# Patient Record
Sex: Male | Born: 1960 | Race: Black or African American | Hispanic: No | Marital: Married | Smoking: Former smoker
Health system: Southern US, Community
[De-identification: ages and names within clinical notes are randomized; demographics above are authoritative.]

## PROBLEM LIST (undated history)

## (undated) DIAGNOSIS — R42 Dizziness and giddiness: Secondary | ICD-10-CM

## (undated) DIAGNOSIS — R160 Hepatomegaly, not elsewhere classified: Secondary | ICD-10-CM

## (undated) DIAGNOSIS — I10 Essential (primary) hypertension: Secondary | ICD-10-CM

## (undated) DIAGNOSIS — C189 Malignant neoplasm of colon, unspecified: Secondary | ICD-10-CM

## (undated) DIAGNOSIS — K589 Irritable bowel syndrome without diarrhea: Secondary | ICD-10-CM

## (undated) DIAGNOSIS — R7989 Other specified abnormal findings of blood chemistry: Secondary | ICD-10-CM

## (undated) DIAGNOSIS — IMO0002 Reserved for concepts with insufficient information to code with codable children: Secondary | ICD-10-CM

## (undated) DIAGNOSIS — E7800 Pure hypercholesterolemia, unspecified: Secondary | ICD-10-CM

## (undated) DIAGNOSIS — E1165 Type 2 diabetes mellitus with hyperglycemia: Secondary | ICD-10-CM

## (undated) HISTORY — DX: Essential (primary) hypertension: I10

## (undated) HISTORY — DX: Type 2 diabetes mellitus with hyperglycemia: E11.65

## (undated) HISTORY — DX: Reserved for concepts with insufficient information to code with codable children: IMO0002

## (undated) HISTORY — DX: Irritable bowel syndrome, unspecified: K58.9

## (undated) HISTORY — DX: Dizziness and giddiness: R42

## (undated) HISTORY — DX: Other specified abnormal findings of blood chemistry: R79.89

---

## 1985-09-10 HISTORY — PX: KNEE SURGERY: SHX244

## 2004-05-02 ENCOUNTER — Encounter: Admission: RE | Admit: 2004-05-02 | Discharge: 2004-05-02 | Payer: Self-pay | Admitting: Internal Medicine

## 2011-09-07 ENCOUNTER — Ambulatory Visit
Admission: RE | Admit: 2011-09-07 | Discharge: 2011-09-07 | Disposition: A | Discharge status: Home / Self Care | Payer: 59 | Source: Ambulatory Visit | Attending: Family Medicine | Admitting: Family Medicine

## 2011-09-07 ENCOUNTER — Other Ambulatory Visit: Payer: Self-pay | Admitting: Family Medicine

## 2011-09-07 DIAGNOSIS — R509 Fever, unspecified: Secondary | ICD-10-CM

## 2011-09-07 DIAGNOSIS — R059 Cough, unspecified: Secondary | ICD-10-CM

## 2011-09-07 DIAGNOSIS — R05 Cough: Secondary | ICD-10-CM

## 2011-11-16 ENCOUNTER — Other Ambulatory Visit: Payer: Self-pay | Admitting: Gastroenterology

## 2011-11-20 ENCOUNTER — Ambulatory Visit
Admission: RE | Admit: 2011-11-20 | Discharge: 2011-11-20 | Disposition: A | Discharge status: Home / Self Care | Payer: 59 | Source: Ambulatory Visit | Attending: Gastroenterology | Admitting: Gastroenterology

## 2012-05-09 ENCOUNTER — Other Ambulatory Visit: Payer: Self-pay | Admitting: Gastroenterology

## 2012-05-09 DIAGNOSIS — C187 Malignant neoplasm of sigmoid colon: Secondary | ICD-10-CM

## 2012-05-09 HISTORY — PX: COLONOSCOPY W/ BIOPSIES: SHX1374

## 2012-05-09 LAB — HM COLONOSCOPY

## 2012-05-13 ENCOUNTER — Ambulatory Visit
Admission: RE | Admit: 2012-05-13 | Discharge: 2012-05-13 | Disposition: A | Discharge status: Home / Self Care | Payer: 59 | Source: Ambulatory Visit | Attending: Gastroenterology | Admitting: Gastroenterology

## 2012-05-13 DIAGNOSIS — C187 Malignant neoplasm of sigmoid colon: Secondary | ICD-10-CM

## 2012-05-14 ENCOUNTER — Ambulatory Visit
Admission: RE | Admit: 2012-05-14 | Discharge: 2012-05-14 | Disposition: A | Discharge status: Home / Self Care | Payer: 59 | Source: Ambulatory Visit | Attending: Gastroenterology | Admitting: Gastroenterology

## 2012-05-14 MED ORDER — IOHEXOL 300 MG/ML  SOLN
100.0000 mL | Freq: Once | INTRAMUSCULAR | Status: AC | PRN
  Administered 2012-05-14: 100 mL via INTRAVENOUS

## 2012-05-19 ENCOUNTER — Encounter (INDEPENDENT_AMBULATORY_CARE_PROVIDER_SITE_OTHER): Payer: Self-pay | Admitting: Surgery

## 2012-05-19 ENCOUNTER — Ambulatory Visit (INDEPENDENT_AMBULATORY_CARE_PROVIDER_SITE_OTHER): Payer: 59 | Admitting: Surgery

## 2012-05-19 VITALS — BP 116/68 | HR 70 | Temp 97.4°F | Resp 16 | Ht 72.0 in | Wt 197.0 lb

## 2012-05-19 DIAGNOSIS — C187 Malignant neoplasm of sigmoid colon: Secondary | ICD-10-CM | POA: Insufficient documentation

## 2012-05-19 DIAGNOSIS — R160 Hepatomegaly, not elsewhere classified: Secondary | ICD-10-CM

## 2012-05-19 DIAGNOSIS — K635 Polyp of colon: Secondary | ICD-10-CM

## 2012-05-19 DIAGNOSIS — K769 Liver disease, unspecified: Secondary | ICD-10-CM

## 2012-05-19 DIAGNOSIS — D126 Benign neoplasm of colon, unspecified: Secondary | ICD-10-CM

## 2012-05-19 DIAGNOSIS — K429 Umbilical hernia without obstruction or gangrene: Secondary | ICD-10-CM

## 2012-05-19 HISTORY — DX: Hepatomegaly, not elsewhere classified: R16.0

## 2012-05-19 NOTE — Patient Instructions (Addendum)
See the Handout(s) we gave you.  Consider surgery.  Please call our office at [phone] if you wish to schedule surgery or if you have further questions / concerns.    Colon Polyps A polyp is extra tissue that grows inside your body. Colon polyps grow in the large intestine. The large intestine, also called the colon, is part of your digestive system. It is a long, hollow tube at the end of your digestive tract where your body makes and stores stool. Most polyps are not dangerous. They are benign. This means they are not cancerous. But over time, some types of polyps can turn into cancer. Polyps that are smaller than a pea are usually not harmful. But larger polyps could someday become or may already be cancerous. To be safe, doctors remove all polyps and test them.  WHO GETS POLYPS? Anyone can get polyps, but certain people are more likely than others. You may have a greater chance of getting polyps if:  You are over 50.   You have had polyps before.   Someone in your family has had polyps.   Someone in your family has had cancer of the large intestine.   Find out if someone in your family has had polyps. You may also be more likely to get polyps if you:   Eat a lot of fatty foods.   Smoke.   Drink alcohol.   Do not exercise.   Eat too much.  SYMPTOMS  Most small polyps do not cause symptoms. People often do not know they have one until their caregiver finds it during a regular checkup or while testing them for something else. Some people do have symptoms like these:  Bleeding from the anus. You might notice blood on your underwear or on toilet paper after you have had a bowel movement.   Constipation or diarrhea that lasts more than a week.   Blood in the stool. Blood can make stool look black or it can show up as red streaks in the stool.  If you have any of these symptoms, see your caregiver. HOW DOES THE DOCTOR TEST FOR POLYPS? The doctor can use four tests to check  for polyps:  Digital rectal exam. The caregiver wears gloves and checks your rectum (the last part of the large intestine) to see if it feels normal. This test would find polyps only in the rectum. Your caregiver may need to do one of the other tests listed below to find polyps higher up in the intestine.   Barium enema. The caregiver puts a liquid called barium into your rectum before taking x-rays of your large intestine. Barium makes your intestine look white in the pictures. Polyps are dark, so they are easy to see.   Sigmoidoscopy. With this test, the caregiver can see inside your large intestine. A thin flexible tube is placed into your rectum. The device is called a sigmoidoscope, which has a light and a tiny video camera in it. The caregiver uses the sigmoidoscope to look at the last third of your large intestine.   Colonoscopy. This test is like sigmoidoscopy, but the caregiver looks at all of the large intestine. It usually requires sedation. This is the most common method for finding and removing polyps.  TREATMENT   The caregiver will remove the polyp during sigmoidoscopy or colonoscopy. The polyp is then tested for cancer.   If you have had polyps, your caregiver may want you to get tested regularly in the future.  PREVENTION  There is not one sure way to prevent polyps. You might be able to lower your risk of getting them if you:  Eat more fruits and vegetables and less fatty food.   Do not smoke.   Avoid alcohol.   Exercise every day.   Lose weight if you are overweight.   Eating more calcium and folate can also lower your risk of getting polyps. Some foods that are rich in calcium are milk, cheese, and broccoli. Some foods that are rich in folate are chickpeas, kidney beans, and spinach.   Aspirin might help prevent polyps. Studies are under way.  Document Released: 05/23/2004 Document Revised: 08/16/2011 Document Reviewed: 10/29/2007 [facility] Patient Information 2012  [place], [place].
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
## 2012-05-23 ENCOUNTER — Ambulatory Visit (INDEPENDENT_AMBULATORY_CARE_PROVIDER_SITE_OTHER): Payer: Self-pay | Admitting: General Surgery

## 2012-05-27 ENCOUNTER — Encounter (HOSPITAL_COMMUNITY): Payer: Self-pay | Admitting: Pharmacy Technician

## 2012-05-30 ENCOUNTER — Encounter (HOSPITAL_COMMUNITY): Payer: Self-pay

## 2012-05-30 ENCOUNTER — Encounter (HOSPITAL_COMMUNITY)
Admission: RE | Admit: 2012-05-30 | Discharge: 2012-05-30 | Disposition: A | Discharge status: Home / Self Care | Payer: 59 | Source: Ambulatory Visit | Attending: Surgery | Admitting: Surgery

## 2012-05-30 LAB — BASIC METABOLIC PANEL
Calcium: 9.2 mg/dL (ref 8.4–10.5)
GFR calc Af Amer: >90 mL/min (ref >90)
GFR calc non Af Amer: 79 mL/min — ABNORMAL LOW (ref >90)
Glucose, Bld: 187 mg/dL — ABNORMAL HIGH (ref 70–99)
Potassium: 4.2 mEq/L (ref 3.5–5.1)
Sodium: 136 mEq/L (ref 135–145)

## 2012-05-30 LAB — SURGICAL PCR SCREEN
MRSA, PCR: NEGATIVE
Staphylococcus aureus: NEGATIVE

## 2012-05-30 LAB — CBC
Hemoglobin: 10.3 g/dL — ABNORMAL LOW (ref 13.0–17.0)
MCH: 25.7 pg — ABNORMAL LOW (ref 26.0–34.0)
MCHC: 31.5 g/dL (ref 30.0–36.0)
Platelets: 494 10*3/uL — ABNORMAL HIGH (ref 150–400)
RDW: 13.7 % (ref 11.5–15.5)

## 2012-05-30 NOTE — Patient Instructions (Signed)
20      Your procedure is scheduled on:  Thursday 06/05/2012 0730 am  Report to [facility] at 0530 AM.  Call this number if you have problems the morning of surgery: [phone]   Remember:FOLLOW  BOWEL PREP INSTRUCTIONS FROM DR.GROSS'S OFFICE AND FOLLOW CLEAR LIQUID DIET ON 06/04/2012   Do not eat food or drink liquids after midnight 06/03/2012-THEN CLEAR LIQUIDS ON 06/04/2012  Take these medicines the morning of surgery with A SIP OF WATER: PRAVASTATIN   Do not bring valuables to the hospital.  .  Leave suitcase in the car. After surgery it may be brought to your room.  For patients admitted to the hospital, checkout time is 11:00 AM the day of              Discharge.    Special Instructions: See [facility] Preparing  For Surgery Instruction Sheet.  Do not wear jewelry, lotions powders, perfumes. Women do not shave legs or underarms for 12 hours before showers. Contacts, partial plates, or dentures may not be worn into surgery.                          Patients discharged the day of surgery will not be allowed to drive home. If going home the same day of surgery, must have someone stay with you first 24 hrs.at home and arrange for someone to drive you home from the              Hospital.   Please read over the following fact sheets that you were given: MRSA              INFORMATION, BLOOD TRANSFUSION SHEET, INCENTIVE SPIROMETRY SHEET, SLEEP APNEA SHEET               Telford Nab.Amberlin Utke,RN,BSN [phone]

## 2012-06-03 ENCOUNTER — Encounter (INDEPENDENT_AMBULATORY_CARE_PROVIDER_SITE_OTHER): Payer: Self-pay

## 2012-06-04 MED ORDER — DEXTROSE 5 % IV SOLN
2.0000 g | INTRAVENOUS | Status: AC
  Administered 2012-06-05: 2 g via INTRAVENOUS
  Filled 2012-06-04: qty 2

## 2012-06-05 ENCOUNTER — Encounter (HOSPITAL_COMMUNITY): Payer: Self-pay | Admitting: *Deleted

## 2012-06-05 ENCOUNTER — Ambulatory Visit (HOSPITAL_COMMUNITY): Payer: 59 | Admitting: *Deleted

## 2012-06-05 ENCOUNTER — Encounter (HOSPITAL_COMMUNITY)
Admission: RE | Disposition: A | Discharge status: Home / Self Care | Payer: Self-pay | Source: Ambulatory Visit | Attending: Surgery

## 2012-06-05 ENCOUNTER — Inpatient Hospital Stay (HOSPITAL_COMMUNITY)
Admission: RE | Admit: 2012-06-05 | Discharge: 2012-06-08 | DRG: 334 | Disposition: A | Discharge status: Home / Self Care | Payer: 59 | Source: Ambulatory Visit | Attending: Surgery | Admitting: Surgery

## 2012-06-05 DIAGNOSIS — Z888 Allergy status to other drugs, medicaments and biological substances status: Secondary | ICD-10-CM

## 2012-06-05 DIAGNOSIS — K429 Umbilical hernia without obstruction or gangrene: Secondary | ICD-10-CM | POA: Diagnosis present

## 2012-06-05 DIAGNOSIS — C189 Malignant neoplasm of colon, unspecified: Secondary | ICD-10-CM | POA: Insufficient documentation

## 2012-06-05 DIAGNOSIS — E119 Type 2 diabetes mellitus without complications: Secondary | ICD-10-CM | POA: Diagnosis present

## 2012-06-05 DIAGNOSIS — K769 Liver disease, unspecified: Secondary | ICD-10-CM | POA: Diagnosis present

## 2012-06-05 DIAGNOSIS — R160 Hepatomegaly, not elsewhere classified: Secondary | ICD-10-CM | POA: Diagnosis present

## 2012-06-05 DIAGNOSIS — D759 Disease of blood and blood-forming organs, unspecified: Secondary | ICD-10-CM | POA: Diagnosis present

## 2012-06-05 DIAGNOSIS — R599 Enlarged lymph nodes, unspecified: Secondary | ICD-10-CM | POA: Diagnosis present

## 2012-06-05 DIAGNOSIS — K219 Gastro-esophageal reflux disease without esophagitis: Secondary | ICD-10-CM | POA: Diagnosis present

## 2012-06-05 DIAGNOSIS — C19 Malignant neoplasm of rectosigmoid junction: Principal | ICD-10-CM | POA: Diagnosis present

## 2012-06-05 DIAGNOSIS — K589 Irritable bowel syndrome without diarrhea: Secondary | ICD-10-CM | POA: Diagnosis present

## 2012-06-05 DIAGNOSIS — E7800 Pure hypercholesterolemia, unspecified: Secondary | ICD-10-CM | POA: Diagnosis present

## 2012-06-05 DIAGNOSIS — Z79899 Other long term (current) drug therapy: Secondary | ICD-10-CM

## 2012-06-05 DIAGNOSIS — C187 Malignant neoplasm of sigmoid colon: Secondary | ICD-10-CM | POA: Diagnosis present

## 2012-06-05 DIAGNOSIS — I10 Essential (primary) hypertension: Secondary | ICD-10-CM | POA: Diagnosis present

## 2012-06-05 DIAGNOSIS — D649 Anemia, unspecified: Secondary | ICD-10-CM | POA: Diagnosis present

## 2012-06-05 HISTORY — PX: LAPAROSCOPIC INCISIONAL / UMBILICAL / VENTRAL HERNIA REPAIR: SUR789

## 2012-06-05 HISTORY — DX: Hepatomegaly, not elsewhere classified: R16.0

## 2012-06-05 HISTORY — DX: Pure hypercholesterolemia, unspecified: E78.00

## 2012-06-05 HISTORY — PX: PROCTOSCOPY: SHX2266

## 2012-06-05 HISTORY — PX: UMBILICAL HERNIA REPAIR: SHX196

## 2012-06-05 LAB — CREATININE, SERUM: GFR calc Af Amer: 78 mL/min — ABNORMAL LOW (ref >90)

## 2012-06-05 LAB — GLUCOSE, CAPILLARY: Glucose-Capillary: 140 mg/dL — ABNORMAL HIGH (ref 70–99)

## 2012-06-05 LAB — CBC
HCT: 28.6 % — ABNORMAL LOW (ref 39.0–52.0)
MCV: 81.3 fL (ref 78.0–100.0)
RBC: 3.52 MIL/uL — ABNORMAL LOW (ref 4.22–5.81)
WBC: 13.1 10*3/uL — ABNORMAL HIGH (ref 4.0–10.5)

## 2012-06-05 LAB — TYPE AND SCREEN: ABO/RH(D): O POS

## 2012-06-05 SURGERY — RESECTION, RECTUM, LOW ANTERIOR, LAPAROSCOPIC
Anesthesia: General | Wound class: Contaminated

## 2012-06-05 MED ORDER — LACTATED RINGERS IV SOLN
INTRAVENOUS | Status: DC | PRN
  Administered 2012-06-05 (×3): via INTRAVENOUS

## 2012-06-05 MED ORDER — LACTATED RINGERS IV SOLN: INTRAVENOUS | Status: DC

## 2012-06-05 MED ORDER — ACETAMINOPHEN 10 MG/ML IV SOLN
INTRAVENOUS | Status: DC | PRN
  Administered 2012-06-05: 1000 mg via INTRAVENOUS

## 2012-06-05 MED ORDER — BUPIVACAINE-EPINEPHRINE 0.25% -1:200000 IJ SOLN
INTRAMUSCULAR | Status: DC | PRN
  Administered 2012-06-05: 50 mL

## 2012-06-05 MED ORDER — SODIUM CHLORIDE 0.9 % IV SOLN: 250.0000 mL | INTRAVENOUS | Status: DC | PRN

## 2012-06-05 MED ORDER — HEPARIN SODIUM (PORCINE) 5000 UNIT/ML IJ SOLN
5000.0000 [IU] | Freq: Once | INTRAMUSCULAR | Status: AC
  Administered 2012-06-05: 5000 [IU] via SUBCUTANEOUS
  Filled 2012-06-05: qty 1

## 2012-06-05 MED ORDER — SODIUM CHLORIDE 0.9 % IJ SOLN: 3.0000 mL | INTRAMUSCULAR | Status: DC | PRN

## 2012-06-05 MED ORDER — NAPROXEN 500 MG PO TABS
500.0000 mg | ORAL_TABLET | Freq: Two times a day (BID) | ORAL | Status: DC
  Administered 2012-06-05 – 2012-06-08 (×6): 500 mg via ORAL
  Filled 2012-06-05 (×8): qty 1

## 2012-06-05 MED ORDER — SODIUM CHLORIDE 0.9 % IJ SOLN
3.0000 mL | Freq: Two times a day (BID) | INTRAMUSCULAR | Status: DC
  Administered 2012-06-07: 3 mL via INTRAVENOUS

## 2012-06-05 MED ORDER — INFLUENZA VIRUS VACC SPLIT PF IM SUSP
0.5000 mL | INTRAMUSCULAR | Status: AC
  Administered 2012-06-06: 0.5 mL via INTRAMUSCULAR
  Filled 2012-06-05: qty 0.5

## 2012-06-05 MED ORDER — LACTATED RINGERS IV BOLUS (SEPSIS)
1000.0000 mL | Freq: Three times a day (TID) | INTRAVENOUS | Status: AC | PRN
  Administered 2012-06-07: 1000 mL via INTRAVENOUS

## 2012-06-05 MED ORDER — LIDOCAINE HCL (CARDIAC) 20 MG/ML IV SOLN
INTRAVENOUS | Status: DC | PRN
  Administered 2012-06-05: 75 mg via INTRAVENOUS

## 2012-06-05 MED ORDER — CISATRACURIUM BESYLATE (PF) 10 MG/5ML IV SOLN
INTRAVENOUS | Status: DC | PRN
  Administered 2012-06-05: 10 mg via INTRAVENOUS
  Administered 2012-06-05: 8 mg via INTRAVENOUS
  Administered 2012-06-05: 4 mg via INTRAVENOUS
  Administered 2012-06-05: 2 mg via INTRAVENOUS

## 2012-06-05 MED ORDER — STERILE WATER FOR IRRIGATION IR SOLN
Status: DC | PRN
  Administered 2012-06-05: 1000 mL

## 2012-06-05 MED ORDER — MIDAZOLAM HCL 5 MG/5ML IJ SOLN
INTRAMUSCULAR | Status: DC | PRN
  Administered 2012-06-05 (×2): 1 mg via INTRAVENOUS

## 2012-06-05 MED ORDER — INSULIN ASPART 100 UNIT/ML ~~LOC~~ SOLN
0.0000 [IU] | Freq: Three times a day (TID) | SUBCUTANEOUS | Status: DC
  Administered 2012-06-05 – 2012-06-08 (×4): 2 [IU] via SUBCUTANEOUS

## 2012-06-05 MED ORDER — OXYCODONE HCL 5 MG PO TABS: 5.0000 mg | ORAL_TABLET | ORAL | Status: DC | PRN

## 2012-06-05 MED ORDER — HYDROMORPHONE HCL PF 1 MG/ML IJ SOLN: INTRAMUSCULAR | Status: DC | PRN

## 2012-06-05 MED ORDER — PROMETHAZINE HCL 25 MG/ML IJ SOLN: 12.5000 mg | Freq: Four times a day (QID) | INTRAMUSCULAR | Status: DC | PRN

## 2012-06-05 MED ORDER — COLESTIPOL HCL 1 G PO TABS
1.0000 g | ORAL_TABLET | Freq: Every day | ORAL | Status: DC
  Administered 2012-06-05 – 2012-06-07 (×2): 1 g via ORAL
  Filled 2012-06-05 (×4): qty 1

## 2012-06-05 MED ORDER — METOPROLOL TARTRATE 12.5 MG HALF TABLET
12.5000 mg | ORAL_TABLET | Freq: Two times a day (BID) | ORAL | Status: DC | PRN
  Filled 2012-06-05: qty 1

## 2012-06-05 MED ORDER — METFORMIN HCL 500 MG PO TABS
500.0000 mg | ORAL_TABLET | Freq: Every day | ORAL | Status: DC
  Administered 2012-06-06 – 2012-06-08 (×3): 500 mg via ORAL
  Filled 2012-06-05 (×4): qty 1

## 2012-06-05 MED ORDER — SACCHAROMYCES BOULARDII 250 MG PO CAPS
250.0000 mg | ORAL_CAPSULE | Freq: Two times a day (BID) | ORAL | Status: DC
  Administered 2012-06-05 – 2012-06-08 (×7): 250 mg via ORAL
  Filled 2012-06-05 (×8): qty 1

## 2012-06-05 MED ORDER — INSULIN ASPART 100 UNIT/ML ~~LOC~~ SOLN: 0.0000 [IU] | Freq: Every day | SUBCUTANEOUS | Status: DC

## 2012-06-05 MED ORDER — ACETAMINOPHEN 10 MG/ML IV SOLN
INTRAVENOUS | Status: AC
  Filled 2012-06-05: qty 100

## 2012-06-05 MED ORDER — PROPOFOL 10 MG/ML IV EMUL
INTRAVENOUS | Status: DC | PRN
  Administered 2012-06-05: 200 mg via INTRAVENOUS

## 2012-06-05 MED ORDER — LORAZEPAM 2 MG/ML IJ SOLN: 0.5000 mg | Freq: Three times a day (TID) | INTRAMUSCULAR | Status: DC | PRN

## 2012-06-05 MED ORDER — LIP MEDEX EX OINT
1.0000 "application " | TOPICAL_OINTMENT | Freq: Two times a day (BID) | CUTANEOUS | Status: DC
  Administered 2012-06-05 – 2012-06-07 (×3): 1 via TOPICAL
  Filled 2012-06-05 (×2): qty 7

## 2012-06-05 MED ORDER — BUPIVACAINE 0.25 % ON-Q PUMP DUAL CATH 300 ML
300.0000 mL | INJECTION | Status: DC
  Filled 2012-06-05: qty 300

## 2012-06-05 MED ORDER — HYDROMORPHONE HCL PF 1 MG/ML IJ SOLN
0.5000 mg | INTRAMUSCULAR | Status: DC | PRN
  Administered 2012-06-05 – 2012-06-06 (×6): 1 mg via INTRAVENOUS
  Filled 2012-06-05 (×6): qty 1

## 2012-06-05 MED ORDER — ALVIMOPAN 12 MG PO CAPS
12.0000 mg | ORAL_CAPSULE | Freq: Once | ORAL | Status: AC
  Administered 2012-06-05: 12 mg via ORAL
  Filled 2012-06-05: qty 1

## 2012-06-05 MED ORDER — FENTANYL CITRATE 0.05 MG/ML IJ SOLN
INTRAMUSCULAR | Status: DC | PRN
  Administered 2012-06-05: 50 ug via INTRAVENOUS
  Administered 2012-06-05: 100 ug via INTRAVENOUS
  Administered 2012-06-05 (×6): 50 ug via INTRAVENOUS

## 2012-06-05 MED ORDER — ONDANSETRON HCL 4 MG/2ML IJ SOLN
INTRAMUSCULAR | Status: DC | PRN
  Administered 2012-06-05 (×2): 2 mg via INTRAVENOUS

## 2012-06-05 MED ORDER — DEXTROSE 5 % IV SOLN
2.0000 g | Freq: Once | INTRAVENOUS | Status: AC
  Administered 2012-06-05: 2 g via INTRAVENOUS
  Filled 2012-06-05: qty 2

## 2012-06-05 MED ORDER — HYDROMORPHONE HCL PF 1 MG/ML IJ SOLN
INTRAMUSCULAR | Status: DC | PRN
  Administered 2012-06-05 (×2): 1 mg via INTRAVENOUS

## 2012-06-05 MED ORDER — MAGIC MOUTHWASH
15.0000 mL | Freq: Four times a day (QID) | ORAL | Status: DC | PRN
  Filled 2012-06-05: qty 15

## 2012-06-05 MED ORDER — LORAZEPAM BOLUS VIA INFUSION: 0.5000 mg | Freq: Three times a day (TID) | INTRAVENOUS | Status: DC | PRN

## 2012-06-05 MED ORDER — BUPIVACAINE 0.25 % ON-Q PUMP DUAL CATH 300 ML
INJECTION | Status: DC | PRN
  Administered 2012-06-05: 300 mL

## 2012-06-05 MED ORDER — LACTATED RINGERS IV SOLN
INTRAVENOUS | Status: DC
  Administered 2012-06-05 – 2012-06-06 (×3): via INTRAVENOUS

## 2012-06-05 MED ORDER — 0.9 % SODIUM CHLORIDE (POUR BTL) OPTIME
TOPICAL | Status: DC | PRN
  Administered 2012-06-05: 1000 mL

## 2012-06-05 MED ORDER — HYDROMORPHONE HCL PF 1 MG/ML IJ SOLN
0.2500 mg | INTRAMUSCULAR | Status: DC | PRN
  Administered 2012-06-05 (×2): 0.5 mg via INTRAVENOUS

## 2012-06-05 MED ORDER — LACTATED RINGERS IR SOLN
Status: DC | PRN
  Administered 2012-06-05: 1

## 2012-06-05 MED ORDER — GLYCOPYRROLATE 0.2 MG/ML IJ SOLN
INTRAMUSCULAR | Status: DC | PRN
  Administered 2012-06-05: 0.2 mg via INTRAVENOUS

## 2012-06-05 MED ORDER — ENALAPRIL MALEATE 20 MG PO TABS
20.0000 mg | ORAL_TABLET | Freq: Every morning | ORAL | Status: DC
  Administered 2012-06-05 – 2012-06-08 (×4): 20 mg via ORAL
  Filled 2012-06-05 (×4): qty 1

## 2012-06-05 MED ORDER — HEPARIN SODIUM (PORCINE) 5000 UNIT/ML IJ SOLN
5000.0000 [IU] | Freq: Three times a day (TID) | INTRAMUSCULAR | Status: DC
  Administered 2012-06-06 – 2012-06-08 (×7): 5000 [IU] via SUBCUTANEOUS
  Filled 2012-06-05 (×10): qty 1

## 2012-06-05 MED ORDER — SODIUM CHLORIDE 0.9 % IV SOLN
INTRAVENOUS | Status: AC
  Filled 2012-06-05: qty 6

## 2012-06-05 MED ORDER — DIPHENHYDRAMINE HCL 50 MG/ML IJ SOLN
12.5000 mg | Freq: Four times a day (QID) | INTRAMUSCULAR | Status: DC | PRN
  Administered 2012-06-06: 25 mg via INTRAVENOUS
  Filled 2012-06-05: qty 1

## 2012-06-05 MED ORDER — BUPIVACAINE-EPINEPHRINE 0.25% -1:200000 IJ SOLN
INTRAMUSCULAR | Status: AC
  Filled 2012-06-05: qty 1

## 2012-06-05 MED ORDER — ACETAMINOPHEN 10 MG/ML IV SOLN
INTRAVENOUS | Status: AC
  Filled 2012-06-05: qty 1100

## 2012-06-05 MED ORDER — SODIUM CHLORIDE 0.9 % IR SOLN
Status: DC | PRN
  Administered 2012-06-05: 1000 mL

## 2012-06-05 MED ORDER — HYDROMORPHONE HCL PF 1 MG/ML IJ SOLN
INTRAMUSCULAR | Status: AC
  Filled 2012-06-05: qty 1

## 2012-06-05 MED ORDER — HYOSCYAMINE SULFATE ER 0.375 MG PO TB12
0.3750 mg | ORAL_TABLET | Freq: Two times a day (BID) | ORAL | Status: DC | PRN
  Filled 2012-06-05: qty 1

## 2012-06-05 MED ORDER — ALUM & MAG HYDROXIDE-SIMETH 200-200-20 MG/5ML PO SUSP: 30.0000 mL | Freq: Four times a day (QID) | ORAL | Status: DC | PRN

## 2012-06-05 MED ORDER — BUPIVACAINE-EPINEPHRINE PF 0.25-1:200000 % IJ SOLN
INTRAMUSCULAR | Status: AC
  Filled 2012-06-05: qty 30

## 2012-06-05 MED ORDER — NEOSTIGMINE METHYLSULFATE 1 MG/ML IJ SOLN
INTRAMUSCULAR | Status: DC | PRN
  Administered 2012-06-05: 1 mg via INTRAVENOUS

## 2012-06-05 SURGICAL SUPPLY — 86 items
APPLIER CLIP ROT 10 11.4 M/L (STAPLE)
APR CLP MED LRG 11.4X10 (STAPLE)
BLADE HEX COATED 2.75 (ELECTRODE) ×3 IMPLANT
BLADE SURG 15 STRL LF DISP TIS (BLADE) ×2 IMPLANT
BLADE SURG 15 STRL SS (BLADE) ×3
BLADE SURG ROTATE 9660 (MISCELLANEOUS) IMPLANT
CABLE HIGH FREQUENCY MONO STRZ (ELECTRODE) ×3 IMPLANT
CANISTER SUCTION 2500CC (MISCELLANEOUS) ×3 IMPLANT
CATH KIT ON Q 7.5IN SLV (PAIN MANAGEMENT) ×2 IMPLANT
CELLS DAT CNTRL 66122 CELL SVR (MISCELLANEOUS) IMPLANT
CHLORAPREP W/TINT 26ML (MISCELLANEOUS) ×3 IMPLANT
CLIP APPLIE ROT 10 11.4 M/L (STAPLE) IMPLANT
CLOTH BEACON ORANGE TIMEOUT ST (SAFETY) ×3 IMPLANT
COVER SURGICAL LIGHT HANDLE (MISCELLANEOUS) ×3 IMPLANT
DECANTER SPIKE VIAL GLASS SM (MISCELLANEOUS) ×5 IMPLANT
DISSECTOR BLUNT TIP ENDO 5MM (MISCELLANEOUS) IMPLANT
DRAPE LAPAROSCOPIC ABDOMINAL (DRAPES) ×3 IMPLANT
DRAPE WARM FLUID 44X44 (DRAPE) ×4 IMPLANT
DRSG TEGADERM 4X4.75 (GAUZE/BANDAGES/DRESSINGS) ×4 IMPLANT
ELECT CAUTERY BLADE 6.4 (BLADE) ×3 IMPLANT
ELECT REM PT RETURN 9FT ADLT (ELECTROSURGICAL) ×3
ELECTRODE REM PT RTRN 9FT ADLT (ELECTROSURGICAL) ×2 IMPLANT
ENDOLOOP SUT PDS II  0 18 (SUTURE) ×2
ENDOLOOP SUT PDS II 0 18 (SUTURE) IMPLANT
GLOVE BIO SURGEON STRL SZ 6 (GLOVE) ×4 IMPLANT
GLOVE BIOGEL PI IND STRL 6.5 (GLOVE) ×2 IMPLANT
GLOVE BIOGEL PI IND STRL 7.0 (GLOVE) ×2 IMPLANT
GLOVE BIOGEL PI INDICATOR 6.5 (GLOVE) ×1
GLOVE BIOGEL PI INDICATOR 7.0 (GLOVE) ×1
GLOVE ECLIPSE 8.0 STRL XLNG CF (GLOVE) ×3 IMPLANT
GLOVE INDICATOR 8.0 STRL GRN (GLOVE) ×3 IMPLANT
GOWN PREVENTION PLUS XXLARGE (GOWN DISPOSABLE) ×4 IMPLANT
GOWN STRL NON-REIN LRG LVL3 (GOWN DISPOSABLE) ×4 IMPLANT
GOWN STRL REIN XL XLG (GOWN DISPOSABLE) ×7 IMPLANT
KIT BASIN OR (CUSTOM PROCEDURE TRAY) ×3 IMPLANT
LEGGING LITHOTOMY PAIR STRL (DRAPES) IMPLANT
LIGASURE IMPACT 36 18CM CVD LR (INSTRUMENTS) IMPLANT
NEEDLE HYPO 22GX1.5 SAFETY (NEEDLE) ×3 IMPLANT
NS IRRIG 1000ML POUR BTL (IV SOLUTION) ×6 IMPLANT
PACK BASIC VI WITH GOWN DISP (CUSTOM PROCEDURE TRAY) ×3 IMPLANT
PENCIL BUTTON HOLSTER BLD 10FT (ELECTRODE) ×3 IMPLANT
RETRACTOR WND ALEXIS 18 MED (MISCELLANEOUS) IMPLANT
RTRCTR WOUND ALEXIS 18CM MED (MISCELLANEOUS)
SCALPEL HARMONIC ACE (MISCELLANEOUS) IMPLANT
SCISSORS LAP 5X35 DISP (ENDOMECHANICALS) ×1 IMPLANT
SEALER TISSUE G2 CVD JAW 35 (ENDOMECHANICALS) IMPLANT
SEALER TISSUE G2 CVD JAW 45CM (ENDOMECHANICALS) ×1
SET IRRIG TUBING LAPAROSCOPIC (IRRIGATION / IRRIGATOR) ×1 IMPLANT
SLEEVE ENDOPATH XCEL 5M (ENDOMECHANICALS) ×3 IMPLANT
SPONGE GAUZE 4X4 12PLY (GAUZE/BANDAGES/DRESSINGS) ×2 IMPLANT
SPONGE LAP 18X18 X RAY DECT (DISPOSABLE) ×4 IMPLANT
STAPLER CIRC ILS CVD 33MM 37CM (STAPLE) ×1 IMPLANT
STAPLER CUT CVD 40MM BLUE (STAPLE) ×1 IMPLANT
STAPLER VISISTAT 35W (STAPLE) ×2 IMPLANT
SUCTION POOLE TIP (SUCTIONS) ×3 IMPLANT
SUT ETHIBOND NAB CT1 #1 30IN (SUTURE) IMPLANT
SUT MNCRL AB 4-0 PS2 18 (SUTURE) ×3 IMPLANT
SUT NOVA NAB GS-21 0 18 T12 DT (SUTURE) IMPLANT
SUT PROLENE 0 CT 1 30 (SUTURE) IMPLANT
SUT PROLENE 0 CT 1 CR/8 (SUTURE) IMPLANT
SUT PROLENE 0 CT 2 (SUTURE) ×1 IMPLANT
SUT PROLENE 2 0 CT2 30 (SUTURE) IMPLANT
SUT PROLENE 2 0 KS (SUTURE) IMPLANT
SUT SILK 2 0 (SUTURE) ×3
SUT SILK 2 0 SH CR/8 (SUTURE) ×3 IMPLANT
SUT SILK 2-0 18XBRD TIE 12 (SUTURE) ×2 IMPLANT
SUT SILK 3 0 (SUTURE) ×3
SUT SILK 3 0 SH CR/8 (SUTURE) ×3 IMPLANT
SUT SILK 3-0 18XBRD TIE 12 (SUTURE) ×2 IMPLANT
SYR 20CC LL (SYRINGE) ×3 IMPLANT
SYR BULB IRRIGATION 50ML (SYRINGE) ×3 IMPLANT
SYS LAPSCP GELPORT 120MM (MISCELLANEOUS) ×3
SYSTEM LAPSCP GELPORT 120MM (MISCELLANEOUS) IMPLANT
TOWEL OR 17X26 10 PK STRL BLUE (TOWEL DISPOSABLE) ×4 IMPLANT
TRAY FOLEY CATH 14FRSI W/METER (CATHETERS) ×1 IMPLANT
TRAY LAP CHOLE (CUSTOM PROCEDURE TRAY) ×3 IMPLANT
TROCAR XCEL 12X100 BLDLESS (ENDOMECHANICALS) IMPLANT
TROCAR XCEL BLUNT TIP 100MML (ENDOMECHANICALS) ×2 IMPLANT
TROCAR XCEL NON-BLD 11X100MML (ENDOMECHANICALS) IMPLANT
TROCAR XCEL NON-BLD 5MMX100MML (ENDOMECHANICALS) ×3 IMPLANT
TROCAR Z-THREAD FIOS 5X100MM (TROCAR) ×2 IMPLANT
TUBING FILTER THERMOFLATOR (ELECTROSURGICAL) ×3 IMPLANT
TUNNELER SHEATH ON-Q 16GX12 DP (PAIN MANAGEMENT) ×1 IMPLANT
WATER STERILE IRR 1000ML POUR (IV SOLUTION) ×3 IMPLANT
YANKAUER SUCT BULB TIP 10FT TU (MISCELLANEOUS) ×3 IMPLANT
YANKAUER SUCT BULB TIP NO VENT (SUCTIONS) ×5 IMPLANT

## 2012-06-05 NOTE — Preoperative (Signed)
Beta Blockers   Reason not to administer Beta Blockers:Not Applicable 

## 2012-06-05 NOTE — Anesthesia Preprocedure Evaluation (Addendum)
Anesthesia Evaluation  Patient identified by MRN, date of birth, ID band Patient awake    Reviewed: Allergy & Precautions, H&P , NPO status , Patient's Chart, lab work & pertinent test results  Airway Mallampati: II TM Distance: >3 FB Neck ROM: full    Dental  (+) Caps and Dental Advisory Given,    Pulmonary neg pulmonary ROS,  breath sounds clear to auscultation  Pulmonary exam normal       Cardiovascular Exercise Tolerance: Good hypertension, Pt. on medications negative cardio ROS  Rhythm:regular Rate:Normal     Neuro/Psych negative neurological ROS  negative psych ROS   GI/Hepatic negative GI ROS, Neg liver ROS,   Endo/Other  negative endocrine ROSdiabetes, Well Controlled, Type 2, Oral Hypoglycemic Agents  Renal/GU negative Renal ROS  negative genitourinary   Musculoskeletal   Abdominal   Peds  Hematology negative hematology ROS (+) Blood dyscrasia, anemia , Hgb. 10.3   Anesthesia Other Findings   Reproductive/Obstetrics negative OB ROS                          Anesthesia Physical Anesthesia Plan  ASA: III  Anesthesia Plan: General   Post-op Pain Management:    Induction: Intravenous  Airway Management Planned: Oral ETT  Additional Equipment:   Intra-op Plan:   Post-operative Plan: Extubation in OR  Informed Consent: I have reviewed the patients History and Physical, chart, labs and discussed the procedure including the risks, benefits and alternatives for the proposed anesthesia with the patient or authorized representative who has indicated his/her understanding and acceptance.   Dental Advisory Given  Plan Discussed with: CRNA and Surgeon  Anesthesia Plan Comments:         Anesthesia Quick Evaluation

## 2012-06-05 NOTE — Anesthesia Procedure Notes (Signed)
Procedure Name: Intubation Date/Time: 06/05/2012 7:46 AM Performed by: Edison Pace Pre-anesthesia Checklist: Patient identified, Timeout performed, Emergency Drugs available, Suction available and Patient being monitored Patient Re-evaluated:Patient Re-evaluated prior to inductionOxygen Delivery Method: Circle system utilized Preoxygenation: Pre-oxygenation with 100% oxygen Intubation Type: IV induction Ventilation: Mask ventilation without difficulty Laryngoscope Size: Mac and 4 Grade View: Grade II Tube type: Oral Tube size: 7.5 mm Number of attempts: 1 Airway Equipment and Method: Stylet Placement Confirmation: ETT inserted through vocal cords under direct vision,  positive ETCO2 and breath sounds checked- equal and bilateral Secured at: 21 cm Tube secured with: Tape Dental Injury: Teeth and Oropharynx as per pre-operative assessment
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
## 2012-06-05 NOTE — Progress Notes (Signed)
Patient frequently on bedpan for passage of soft brown formed BMs. Patient relay had milk of magnesia prior surgery & has problem with constipation. Dr. Michaell Cowing paged for follow-up. Page returned without new orders. Continue to monitor stools and any c/o discomfort.

## 2012-06-05 NOTE — Transfer of Care (Signed)
Immediate Anesthesia Transfer of Care Note  Patient: William Barber  Procedure(s) Performed: Procedure(s) (LRB) with comments: HERNIA REPAIR UMBILICAL ADULT (N/A) LAPAROSCOPIC LOW ANTERIOR RESECTION (N/A) - laparoscopic assisted low anterior resection PROCTOSCOPY () - rigid proctoscopy   Patient Location: PACU  Anesthesia Type: General  Level of Consciousness: awake, oriented and patient cooperative  Airway & Oxygen Therapy: Patient Spontanous Breathing and Patient connected to face mask oxygen  Post-op Assessment: Report given to PACU RN, Post -op Vital signs reviewed and stable and Patient moving all extremities  Post vital signs: Reviewed and stable  Complications: No apparent anesthesia complications

## 2012-06-05 NOTE — Op Note (Signed)
06/05/2012  11:18 AM  PATIENT:  William Barber  51 y.o. male  Patient Care Team: Thora Lance, MD as PCP - General (Family Medicine) Petra Kuba, MD as Consulting Physician (Gastroenterology) Ardeth Sportsman, MD as Consulting Physician (General Surgery)  PRE-OPERATIVE DIAGNOSIS:  Sigmoid mass with high grade dysplasia, umbilical hernia  POST-OPERATIVE DIAGNOSIS:  Rectosigmoid mass with high grade dysplasia , umbilical hernia  PROCEDURE:  Procedure(s): HERNIA REPAIR UMBILICAL ADULT (Primary) LAPAROSCOPIC LOW ANTERIOR RESECTION PROCTOSCOPY  SURGEON:  Surgeon(s):  Ardeth Sportsman, MD  ASSISTANT:  Kandis Cocking, MD   ANESTHESIA:   local and general  EBL:  Total I/O In: 2000 [I.V.:2000] Out: 575 [Urine:450; Other:100; Blood:25]  Delay start of Pharmacological VTE agent (>24hrs) due to surgical blood loss or risk of bleeding:  no  DRAINS: none   SPECIMEN:  Source of Specimen:  Rectosigmoid colon (open end proximal) with anastomotic rings (blue stitch in proximal ring)  DISPOSITION OF SPECIMEN:  PATHOLOGY  COUNTS:  YES  PLAN OF CARE: Admit to inpatient   PATIENT DISPOSITION:  PACU - hemodynamically stable.  INDICATION: A pleasant middle-age male found to have a very large mass in his distal sigmoid colon on colonoscopy.  Biopsy shows high-grade dysplasia.  Elevated CEA.  CT scan suspicious for cancer.  I recommended segmental resection:  The anatomy & physiology of the digestive tract was discussed.  The pathophysiology was discussed.  Natural history risks without surgery was discussed.   I worked to give an overview of the disease and the frequent need to have multispecialty involvement.  I feel the risks of no intervention will lead to serious problems that outweigh the operative risks; therefore, I recommended a partial colectomy to remove the pathology.  Laparoscopic & open techniques were discussed.   Risks such as bleeding, infection, abscess, leak, reoperation,  possible ostomy, hernia, heart attack, death, and other risks were discussed.  I noted a good likelihood this will help address the problem.   Goals of post-operative recovery were discussed as well.  We will work to minimize complications.  An educational handout on the pathology was given as well.  Questions were answered.    The patient expresses understanding & wishes to proceed with surgery.  OR FINDINGS: He had a very large lesion at the rectosigmoid junction about the size of a baseball.  Strongly suspicious for cancer.  Enlarged lymph nodes going down to the superior mesenteric artery.  No obvious metastatic disease on visceral parietal peritoneum or liver.  The anastomosis rests 13 cm from the anal verge, just distal to the sacral promontory.  DESCRIPTION:   Informed consent was confirmed.  The patient underwent general anaesthesia without difficulty.  The patient was positioned appropriately.  VTE prevention in place.  The patient's abdomen was clipped, prepped, & draped in a sterile fashion.  Surgical timeout confirmed our plan.  The patient was positioned in reverse Trendelenburg.  Abdominal entry was gained using optical entry technique in the right upper abdomen.  Entry was clean.  I induced carbon dioxide insufflation.  Camera inspection revealed no injury.  Extra ports were carefully placed under direct laparoscopic visualization.  I reflected the greater omentum and the upper abdomen the small bowel in the upper abdomen. I scored the base of peritoneum of the mesentery of the left colon  from the ligament of Treitz to the peritoneal rectal reflection on the right side.  He had an obvious bulky mass at the very distal sigmoid going to  the rectosigmoid junction.  Obvious tattooing was just distal to it.   I elevated the sigmoid mesentery and got into the retro-mesenteric plane. We were able to identify the left ureter and gonadal vessels. We kept those posterior within the  retroperitoneum and elevated the left colon mesentery off that. I did free off of mesentery to the IMA pedicle but did not ligate it yet.  He had obviously enlarged lymph nodes.  They were stained purple consistent with a sentinel node effect from the tattooing done just distal to the mass. I continued distally and got into the avascular plane posterior to the mesorectum. This allowed me to help mobilize the rectum as well by freeing the mesorectum off the sacrum.I mobilized the peritoneal coverings to the paregoric reflection on both the right and left sides of the rectum.  I could see the right and left ureters and stayed away from the house.  I kept in the lateral pedicle stalks intact  I skeletonized the lymph nodes off the inferior mesenteric artery pedicle.  I went down to its takeoff from the aorta.  I isolated the inferior mesenteric vein off of the ligament of Treitz just cephalad to that as well.  He had some moderate adhesions of his proximal sigmoid colon to the ligament of Treitz but elevated dose of Fritos off to get better mobilization.  After confirming the left ureter was out of the way, I went ahead and ligated the inferior mesenteric artery pedicle just near its takeoff from the aorta.  I ligated it with a 0 PDS Endoloop.  I did ligate the inferior mesenteric vein in a similar fashion.  We ensured hemostasis. I skeletonized the mesorectum At the junction between the distal and mid rectum.  I did this using a bipolar system.. I mobilized the left colon some more to ensure good mobilization of the left colon to reach into the pelvis.  I placed a GelPort has a wound protector through a Pfannenstiel incision in the suprapubic region, taking care to avoid bladder injury. I transected at the junction between the proximal and mid rectum using a contour stapler. I was able to eviscerate the rectosigmoid and descending colon out the wound.  I chose a region at the descending/sigmoid junction that was  soft and easily reached down. I clamped the colon proximal to this area. I used a soft bowel clamp. I transected at the descending/sigmoid junction. I got healthy bleeding mucosa. I hemostatically transected the remaining specimen mesentery and sent the rectosigmoid colon specimen off to go to pathology.  We sized the colon orifice.  I chose a 33 EEA anvil stapler system. I placed the anvil to the open end of the descending colon and closed around it using a 0 Prolene pursestring.  I scrubbed down and did gentle anal dilation and advanced the EEA stapler up the rectal stump. The spike was brought out at the provimal end of the rectal stump under direct visualization.  Dr. Ezzard Standing attached the anvil of the proximal colon the spike of the stapler. Anvil was tightened down and held clamped for 60 seconds. The EEA stapler was fired and held clamped for 30 seconds. The stapler was released & removed. Noted 2o excellent anastomotic rings. Blue stitch is in the proximal ring.  He did rigid proctoscopy noted the anastomosis was at 13 cm from the anal verge consistent with the proximal rectum. I insufflated the rectum while he proximally clamped the colon. There was a negative air leak test.  There was some tension. It looked viable.  We changed gown and gloves.  We did copious irrigation with numerous liters of saline. We did a final irrigation of 900 mg clindamycin/240 mg gentamicin. We held for 5 minutes. I then did diagnostic laparoscopy deferred excellent hemostasis along the left colon dissection and retroperitoneum. The anastomosis looked healthy. It was tight at the anastomosis.  I mobilized the left colon proximally up to the splenic flexure.  With releasing some lateral bands that take the tension off of the anastomosis rested well and comfortable.   We aspirated the antibiotic irrigation. Hemostasis was good. I removed gas out of the ports. I closed the 5mm port sites using Monocryl stitch and sterile  dressing. I placed On-Q catheter and sheaths into the preperitoneal space under direct palpation. Closed the Pfannenstiel wound using a 0 Vicryl vertical peritoneal closure and a #1 PDS transverse anterior rectal fascial closure. I closed the skin with some interrupted Monocryl stitches. I placed soaked wicks in between those areas. I placed sterile dressing.  Patient is being extubated go to recovery room. I discussed postop care with the patient in detail the office in the holding area. Instructions are written. I'm about to locate family and discuss it with them as well.

## 2012-06-05 NOTE — Anesthesia Postprocedure Evaluation (Addendum)
  Anesthesia Post-op Note  Patient: William Barber  Procedure(s) Performed: Procedure(s) (LRB): HERNIA REPAIR UMBILICAL ADULT (N/A) LAPAROSCOPIC LOW ANTERIOR RESECTION (N/A) PROCTOSCOPY ()  Patient Location: PACU  Anesthesia Type: General  Level of Consciousness: awake and alert   Airway and Oxygen Therapy: Patient Spontanous Breathing  Post-op Pain: mild  Post-op Assessment: Post-op Vital signs reviewed, Patient's Cardiovascular Status Stable, Respiratory Function Stable, Patent Airway and No signs of Nausea or vomiting  Post-op Vital Signs: stable  Complications: No apparent anesthesia complications

## 2012-06-05 NOTE — Transfer of Care (Signed)
Immediate Anesthesia Transfer of Care Note  Patient: William Barber  Procedure(s) Performed: Procedure(s) (LRB) with comments: HERNIA REPAIR UMBILICAL ADULT (N/A) LAPAROSCOPIC LOW ANTERIOR RESECTION (N/A) - laparoscopic assisted low anterior resection PROCTOSCOPY () - rigid proctoscopy   Patient Location: PACU  Anesthesia Type: General  Level of Consciousness: awake, oriented, patient cooperative, lethargic and responds to stimulation  Airway & Oxygen Therapy: Patient Spontanous Breathing and Patient connected to face mask oxygen  Post-op Assessment: Report given to PACU RN, Post -op Vital signs reviewed and stable and Patient moving all extremities  Post vital signs: Reviewed and stable  Complications: No apparent anesthesia complications

## 2012-06-05 NOTE — Interval H&P Note (Signed)
History and Physical Interval Note:  06/05/2012 7:25 AM  William Barber  has presented today for surgery, with the diagnosis of sigmoid mass, umbilical hernia  The various methods of treatment have been discussed with the patient and family. After consideration of risks, benefits and other options for treatment, the patient has consented to  Procedure(s) (LRB) with comments: LAPAROSCOPIC SIGMOID COLECTOMY (N/A) - Laparoscopic Sigmoid Colectomy HERNIA REPAIR UMBILICAL ADULT (N/A) as a surgical intervention .  The patient's history has been reviewed, patient examined, no change in status, stable for surgery.  I have reviewed the patient's chart and labs.  Questions were answered to the patient's satisfaction.     Naveen Lorusso C.

## 2012-06-06 ENCOUNTER — Encounter (HOSPITAL_COMMUNITY): Payer: Self-pay | Admitting: Surgery

## 2012-06-06 DIAGNOSIS — K589 Irritable bowel syndrome without diarrhea: Secondary | ICD-10-CM | POA: Insufficient documentation

## 2012-06-06 DIAGNOSIS — I10 Essential (primary) hypertension: Secondary | ICD-10-CM | POA: Diagnosis present

## 2012-06-06 DIAGNOSIS — E119 Type 2 diabetes mellitus without complications: Secondary | ICD-10-CM | POA: Diagnosis present

## 2012-06-06 DIAGNOSIS — E7800 Pure hypercholesterolemia, unspecified: Secondary | ICD-10-CM | POA: Diagnosis present

## 2012-06-06 LAB — BASIC METABOLIC PANEL
Chloride: 99 mEq/L (ref 96–112)
Creatinine, Ser: 1.2 mg/dL (ref 0.50–1.35)
GFR calc Af Amer: 80 mL/min — ABNORMAL LOW (ref >90)
Potassium: 4 mEq/L (ref 3.5–5.1)
Sodium: 135 mEq/L (ref 135–145)

## 2012-06-06 LAB — CBC
HCT: 26.6 % — ABNORMAL LOW (ref 39.0–52.0)
Hemoglobin: 8.3 g/dL — ABNORMAL LOW (ref 13.0–17.0)
RBC: 3.26 MIL/uL — ABNORMAL LOW (ref 4.22–5.81)
RDW: 14.1 % (ref 11.5–15.5)
WBC: 10.1 10*3/uL (ref 4.0–10.5)

## 2012-06-06 LAB — HEMOGLOBIN A1C: Mean Plasma Glucose: 128 mg/dL — ABNORMAL HIGH (ref <117)

## 2012-06-06 LAB — GLUCOSE, CAPILLARY: Glucose-Capillary: 121 mg/dL — ABNORMAL HIGH (ref 70–99)

## 2012-06-06 MED ORDER — ENSURE COMPLETE PO LIQD
237.0000 mL | Freq: Two times a day (BID) | ORAL | Status: DC
  Administered 2012-06-06 – 2012-06-08 (×3): 237 mL via ORAL

## 2012-06-06 MED ORDER — MORPHINE SULFATE 4 MG/ML IJ SOLN
4.0000 mg | INTRAMUSCULAR | Status: DC | PRN
  Administered 2012-06-07: 4 mg via INTRAVENOUS
  Filled 2012-06-06: qty 1

## 2012-06-06 NOTE — Progress Notes (Signed)
INITIAL ADULT NUTRITION ASSESSMENT Date: 06/06/2012   Time: 2:54 PM Reason for Assessment: Nutrition risk   INTERVENTION: Ensure Complete BID. Encouraged high protein food/beverage choices. Will monitor.   ASSESSMENT: Male 51 y.o.  Dx: Polyp of sigmoid colon  Food/Nutrition Related Hx: Pt reports gradual weight loss of 80 pounds in the past 3 years. Pt thought this was due to eating healthier and exercising, however since pt was found to have sigmoid mass he thinks that may have been the cause of the weight loss. Pt's weight relatively stable in the past month. Pt reports eating well PTA, 2 meals/day with great appetite. Pt reports tolerating full liquid diet and is eager to have diet advanced. Pt interested in getting Ensure for additional nutrition - will order. POD#1 hernia repair and low anterior resection.    Hx:  Past Medical History  Diagnosis Date  . Diabetes mellitus   . Hypertension   . Night sweats   . Weight loss   . Colon polyp   . IBS (irritable bowel syndrome)   . Wears glasses   . Hypercholesterolemia 06/06/2012  . Liver mass on CT Sep2013 05/19/2012   Related Meds:  Scheduled Meds:   . cefoTEtan (CEFOTAN) IV  2 g Intravenous Once  . clindamycin / gentamicin INTRAPERITONEAL Lavage irrigation   Intraperitoneal To OR  . colestipol  1 g Oral QHS  . enalapril  20 mg Oral q morning - 10a  . heparin  5,000 Units Subcutaneous Q8H  . HYDROmorphone      . influenza  inactive virus vaccine  0.5 mL Intramuscular Tomorrow-1000  . insulin aspart  0-15 Units Subcutaneous TID WC  . insulin aspart  0-5 Units Subcutaneous QHS  . lip balm  1 application Topical BID  . metFORMIN  500 mg Oral Q breakfast  . naproxen  500 mg Oral BID WC  . saccharomyces boulardii  250 mg Oral BID  . sodium chloride  3 mL Intravenous Q12H   Continuous Infusions:   . bupivacaine 0.25 % ON-Q pump DUAL CATH 300 mL    . lactated ringers 75 mL/hr at 06/06/12 1436   PRN Meds:.sodium chloride, alum  & mag hydroxide-simeth, diphenhydrAMINE, HYDROmorphone (DILAUDID) injection, hyoscyamine, lactated ringers, LORazepam, magic mouthwash, metoprolol tartrate, promethazine, sodium chloride  Ht: 6' (182.9 cm)  Wt: 191 lb (86.637 kg)  Ideal Wt: 178 lb % Ideal Wt: 107  Usual Wt: 271 lb 3 years ago per pt report % Usual Wt: 70  Body mass index is 25.90 kg/(m^2).   Labs:  CMP     Component Value Date/Time   NA 135 06/06/2012 0430   K 4.0 06/06/2012 0430   CL 99 06/06/2012 0430   CO2 29 06/06/2012 0430   GLUCOSE 116* 06/06/2012 0430   BUN 9 06/06/2012 0430   CREATININE 1.20 06/06/2012 0430   CALCIUM 8.6 06/06/2012 0430   GFRNONAA 69* 06/06/2012 0430   GFRAA 80* 06/06/2012 0430   Lab Results  Component Value Date   HGBA1C 6.1* 06/06/2012   CBG (last 3)   Basename 06/06/12 1249 06/06/12 0737 06/05/12 2128  GLUCAP 97 121* 112*    Intake/Output Summary (Last 24 hours) at 06/06/12 1456 Last data filed at 06/06/12 1000  Gross per 24 hour  Intake   1360 ml  Output   3050 ml  Net  -1690 ml   Last BM - 9/26   Diet Order: Full Liquid   IVF:    bupivacaine 0.25 % ON-Q pump DUAL CATH  300 mL   lactated ringers Last Rate: 75 mL/hr at 06/06/12 1436    Estimated Nutritional Needs:   Kcal:2150-2600 Protein:90-105g Fluid:2.1-2.6L  NUTRITION DIAGNOSIS: -Increased nutrient needs (NI-5.1).  Status: Ongoing  RELATED TO: hernia repair and low anterior resection  AS EVIDENCE BY: surgical notes  MONITORING/EVALUATION(Goals): Advance diet as tolerated to regular diet.   EDUCATION NEEDS: -No education needs identified at this time   Dietitian #: [phone]  DOCUMENTATION CODES Per approved criteria  -Not Applicable    William Barber 06/06/2012, 2:54 PM

## 2012-06-06 NOTE — Progress Notes (Signed)
William Barber [id number] 1961-06-06   Subjective:  Loose BMs post-op (colon full of stool in transverse colon intraop) No major events Walking in hallways  Objective:  Vital signs:  Filed Vitals:   06/05/12 1752 06/05/12 2129 06/06/12 0224 06/06/12 0602  BP:  129/71 106/59 126/70  Pulse:  87 68 67  Temp:  99.4 F (37.4 C) 98.4 F (36.9 C) 98.3 F (36.8 C)  TempSrc:  Oral Oral Oral  Resp:  16 16 16   Height: 6' (1.829 m)     Weight: 191 lb (86.637 kg)     SpO2:  98% 97% 98%    Last BM Date: 06/05/12  Intake/Output   Yesterday:  09/26 0701 - 09/27 0700 In: 4286.3 [P.O.:240; I.V.:3996.3; IV Piggyback:50] Out: 2900 [Urine:2775; Blood:25] This shift:     Bowel function:  Flatus: y  BM: y  Physical Exam:  General: Pt awake/alert/oriented x4 in no acute distress.  Sitting up at bedside, calm/relaxed Eyes: PERRL, normal EOM.  Sclera clear.  No icterus Neuro: CN II-XII intact w/o focal sensory/motor deficits. Lymph: No head/neck/groin lymphadenopathy Psych:  No delerium/psychosis/paranoia HENT: Normocephalic, Mucus membranes moist.  No thrush Neck: Supple, No tracheal deviation Chest: No chest wall pain w good excursion CV:  Pulses intact.  Regular rhythm Abdomen: Soft.  Nondistended.  Mildly tender at incisions only.  No incarcerated hernias. Ext:  SCDs BLE.  No mjr edema.  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 male  1 Day Post-Op  Procedure(s): HERNIA REPAIR UMBILICAL ADULT LAPAROSCOPIC LOW ANTERIOR RESECTION PROCTOSCOPY  Recovering well so far  Plan:  -adv diet gradually -f/u path -glc stable - follow for DM -HTN controlled - follow -VTE prophylaxis- SCDs, etc -mobilize as tolerated to help recovery  Ardeth Sportsman,  M.D., F.A.C.S. Gastrointestinal and Minimally Invasive Surgery [facility] [address] [place], [place] [address] [phone] Main / Paging [phone] Voice Mail   06/06/2012  CARE TEAM:  PCP: Thora Lance, MD  Outpatient Care Team: Patient Care Team: Thora Lance, MD as PCP - General (Family Medicine) Petra Kuba, MD as Consulting Physician (Gastroenterology) Ardeth Sportsman, MD as Consulting Physician (General Surgery)  Inpatient Treatment Team: Treatment Team: Attending Provider: Ardeth Sportsman, MD; Technician: Lynden Ang, NT; Registered Nurse: Bethann Goo, RN   Results:   Labs: Results for orders placed during the hospital encounter of 06/05/12 (from the past 48 hour(s))  ABO/RH     Status: Normal   Collection Time   06/05/12  6:00 AM      Component Value Range Comment   ABO/RH(D) O POS     TYPE AND SCREEN     Status: Normal   Collection Time   06/05/12  6:30 AM      Component Value Range Comment   ABO/RH(D) O POS      Antibody Screen NEG      Sample Expiration 06/08/2012     GLUCOSE, CAPILLARY     Status: Abnormal   Collection Time   06/05/12  6:32 AM      Component Value Range Comment   Glucose-Capillary 130 (*) 70 - 99 mg/dL   GLUCOSE, CAPILLARY     Status: Abnormal   Collection Time  06/05/12 11:37 AM      Component Value Range Comment   Glucose-Capillary 173 (*) 70 - 99 mg/dL    Comment 1 Documented in Chart      Comment 2 Notify RN     CBC     Status: Abnormal   Collection Time   06/05/12  1:39 PM      Component Value Range Comment   WBC 13.1 (*) 4.0 - 10.5 K/uL    RBC 3.52 (*) 4.22 - 5.81 MIL/uL    Hemoglobin 9.2 (*) 13.0 - 17.0 g/dL    HCT 16.1 (*) 09.6 - 52.0 %    MCV 81.3  78.0 - 100.0 fL    MCH 26.1  26.0 - 34.0 pg    MCHC 32.2  30.0 - 36.0 g/dL    RDW 04.5  40.9 - 81.1 %    Platelets 441 (*) 150 - 400 K/uL   CREATININE, SERUM     Status: Abnormal   Collection Time   06/05/12   1:39 PM      Component Value Range Comment   Creatinine, Ser 1.23  0.50 - 1.35 mg/dL    GFR calc non Af Amer 67 (*) >90 mL/min    GFR calc Af Amer 78 (*) >90 mL/min   GLUCOSE, CAPILLARY     Status: Abnormal   Collection Time   06/05/12  4:35 PM      Component Value Range Comment   Glucose-Capillary 140 (*) 70 - 99 mg/dL   GLUCOSE, CAPILLARY     Status: Abnormal   Collection Time   06/05/12  9:28 PM      Component Value Range Comment   Glucose-Capillary 112 (*) 70 - 99 mg/dL    Comment 1 Notify RN     BASIC METABOLIC PANEL     Status: Abnormal   Collection Time   06/06/12  4:30 AM      Component Value Range Comment   Sodium 135  135 - 145 mEq/L    Potassium 4.0  3.5 - 5.1 mEq/L    Chloride 99  96 - 112 mEq/L    CO2 29  19 - 32 mEq/L    Glucose, Bld 116 (*) 70 - 99 mg/dL    BUN 9  6 - 23 mg/dL    Creatinine, Ser 9.14  0.50 - 1.35 mg/dL    Calcium 8.6  8.4 - 78.2 mg/dL    GFR calc non Af Amer 69 (*) >90 mL/min    GFR calc Af Amer 80 (*) >90 mL/min   CBC     Status: Abnormal   Collection Time   06/06/12  4:30 AM      Component Value Range Comment   WBC 10.1  4.0 - 10.5 K/uL    RBC 3.26 (*) 4.22 - 5.81 MIL/uL    Hemoglobin 8.3 (*) 13.0 - 17.0 g/dL    HCT 95.6 (*) 21.3 - 52.0 %    MCV 81.6  78.0 - 100.0 fL    MCH 25.5 (*) 26.0 - 34.0 pg    MCHC 31.2  30.0 - 36.0 g/dL    RDW 08.6  57.8 - 46.9 %    Platelets 419 (*) 150 - 400 K/uL     Imaging / Studies: No results found.  Medications / Allergies: per chart  Antibiotics: Anti-infectives     Start     Dose/Rate Route Frequency Ordered Stop   06/05/12 2000   cefoTEtan (CEFOTAN) 2 g in  dextrose 5 % 50 mL IVPB     Comments: Pharmacy may adjust dose strength for optimal dosing      2 g 100 mL/hr over 30 Minutes Intravenous  Once 06/05/12 1305 06/05/12 2117   06/05/12 0815   clindamycin (CLEOCIN) 900 mg, gentamicin (GARAMYCIN) 240 mg in sodium chloride 0.9 % 1,000 mL for intraperitoneal lavage         Intraperitoneal To  Surgery 06/05/12 0755 06/06/12 0815   06/05/12 0600   cefoTEtan (CEFOTAN) 2 g in dextrose 5 % 50 mL IVPB        2 g 100 mL/hr over 30 Minutes Intravenous On call to O.R. 06/04/12 1812 06/05/12 0730

## 2012-06-07 LAB — GLUCOSE, CAPILLARY
Glucose-Capillary: 145 mg/dL — ABNORMAL HIGH (ref 70–99)
Glucose-Capillary: 67 mg/dL — ABNORMAL LOW (ref 70–99)

## 2012-06-07 NOTE — Progress Notes (Signed)
William Barber [id number] 08-22-1961   Subjective:  Had a more solid bowel movement overnight.  C/O a small amt of upper abd incisional pain.  No nausea.  Objective:  Vital signs:  Filed Vitals:   06/06/12 1000 06/06/12 1400 06/06/12 2225 06/07/12 0557  BP: 138/82 114/71 108/65 122/78  Pulse: 80 79 73 66  Temp: 98.7 F (37.1 C) 97.8 F (36.6 C) 98.8 F (37.1 C) 98 F (36.7 C)  TempSrc: Oral Oral Oral Oral  Resp: 20 18 18 18   Height:      Weight:      SpO2: 97% 95% 96% 97%    Last BM Date: 06/05/12  Intake/Output   Yesterday:  09/27 0701 - 09/28 0700 In: 2035 [P.O.:240; I.V.:1795] Out: 3700 [Urine:3700] This shift:  Total I/O In: 240 [P.O.:240] Out: 450 [Urine:450]  Bowel function:  Flatus: y  BM: y  Physical Exam:  General: Pt awake/alert/oriented x4 in no acute distress.  Sitting up at bedside, calm/relaxed Chest: No chest wall pain w good excursion CV:  Pulses intact.  Regular rhythm Abdomen: Soft.  Nondistended.  Mildly tender at incisions only.  No incarcerated hernias. Ext:  SCDs BLE.  No mjr edema.  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 male  2 Days Post-Op  Procedure(s): HERNIA REPAIR UMBILICAL ADULT LAPAROSCOPIC LOW ANTERIOR RESECTION PROCTOSCOPY  Recovering well so far  Plan:  -adv diet to regular -f/u path -glc stable - follow for DM -HTN controlled - follow -VTE prophylaxis- SCDs, etc -mobilize as tolerated to help recovery -anticipate d/c Mon  Ardeth Sportsman, M.D., F.A.C.S. Gastrointestinal and Minimally Invasive Surgery [facility] [address] [place], [place] [address] [phone] Main / Paging [phone] Voice  Mail   06/07/2012  CARE TEAM:  PCP: Thora Lance, MD  Outpatient Care Team: Patient Care Team: Thora Lance, MD as PCP - General (Family Medicine) Petra Kuba, MD as Consulting Physician (Gastroenterology) Ardeth Sportsman, MD as Consulting Physician (General Surgery)  Inpatient Treatment Team: Treatment Team: Attending Provider: Ardeth Sportsman, MD; Registered Nurse: Bethann Goo, RN; Dietitian: Lavena Bullion, RD   Results:   Labs: Results for orders placed during the hospital encounter of 06/05/12 (from the past 48 hour(s))  GLUCOSE, CAPILLARY     Status: Abnormal   Collection Time   06/05/12 11:37 AM      Component Value Range Comment   Glucose-Capillary 173 (*) 70 - 99 mg/dL    Comment 1 Documented in Chart      Comment 2 Notify RN     CBC     Status: Abnormal   Collection Time   06/05/12  1:39 PM      Component Value Range Comment   WBC 13.1 (*) 4.0 - 10.5 K/uL    RBC 3.52 (*) 4.22 - 5.81 MIL/uL    Hemoglobin 9.2 (*) 13.0 - 17.0 g/dL    HCT 47.4 (*) 25.9 - 52.0 %    MCV 81.3  78.0 - 100.0 fL    MCH 26.1  26.0 - 34.0 pg    MCHC 32.2  30.0 - 36.0 g/dL    RDW 56.3  87.5 - 64.3 %    Platelets 441 (*)  150 - 400 K/uL   CREATININE, SERUM     Status: Abnormal   Collection Time   06/05/12  1:39 PM      Component Value Range Comment   Creatinine, Ser 1.23  0.50 - 1.35 mg/dL    GFR calc non Af Amer 67 (*) >90 mL/min    GFR calc Af Amer 78 (*) >90 mL/min   GLUCOSE, CAPILLARY     Status: Abnormal   Collection Time   06/05/12  4:35 PM      Component Value Range Comment   Glucose-Capillary 140 (*) 70 - 99 mg/dL   GLUCOSE, CAPILLARY     Status: Abnormal   Collection Time   06/05/12  9:28 PM      Component Value Range Comment   Glucose-Capillary 112 (*) 70 - 99 mg/dL    Comment 1 Notify RN     BASIC METABOLIC PANEL     Status: Abnormal   Collection Time   06/06/12  4:30 AM      Component Value Range Comment   Sodium 135  135 - 145 mEq/L    Potassium 4.0   3.5 - 5.1 mEq/L    Chloride 99  96 - 112 mEq/L    CO2 29  19 - 32 mEq/L    Glucose, Bld 116 (*) 70 - 99 mg/dL    BUN 9  6 - 23 mg/dL    Creatinine, Ser 1.02  0.50 - 1.35 mg/dL    Calcium 8.6  8.4 - 72.5 mg/dL    GFR calc non Af Amer 69 (*) >90 mL/min    GFR calc Af Amer 80 (*) >90 mL/min   CBC     Status: Abnormal   Collection Time   06/06/12  4:30 AM      Component Value Range Comment   WBC 10.1  4.0 - 10.5 K/uL    RBC 3.26 (*) 4.22 - 5.81 MIL/uL    Hemoglobin 8.3 (*) 13.0 - 17.0 g/dL    HCT 36.6 (*) 44.0 - 52.0 %    MCV 81.6  78.0 - 100.0 fL    MCH 25.5 (*) 26.0 - 34.0 pg    MCHC 31.2  30.0 - 36.0 g/dL    RDW 34.7  42.5 - 95.6 %    Platelets 419 (*) 150 - 400 K/uL   HEMOGLOBIN A1C     Status: Abnormal   Collection Time   06/06/12  4:30 AM      Component Value Range Comment   Hemoglobin A1C 6.1 (*) <5.7 %    Mean Plasma Glucose 128 (*) <117 mg/dL   GLUCOSE, CAPILLARY     Status: Abnormal   Collection Time   06/06/12  7:37 AM      Component Value Range Comment   Glucose-Capillary 121 (*) 70 - 99 mg/dL   GLUCOSE, CAPILLARY     Status: Normal   Collection Time   06/06/12 12:49 PM      Component Value Range Comment   Glucose-Capillary 97  70 - 99 mg/dL   GLUCOSE, CAPILLARY     Status: Abnormal   Collection Time   06/06/12  5:06 PM      Component Value Range Comment   Glucose-Capillary 117 (*) 70 - 99 mg/dL   GLUCOSE, CAPILLARY     Status: Abnormal   Collection Time   06/06/12 10:44 PM      Component Value Range Comment   Glucose-Capillary 107 (*) 70 - 99 mg/dL  GLUCOSE, CAPILLARY     Status: Abnormal   Collection Time   06/07/12  7:29 AM      Component Value Range Comment   Glucose-Capillary 145 (*) 70 - 99 mg/dL    Comment 1 Documented in Chart      Comment 2 Notify RN       Imaging / Studies: No results found.  Medications / Allergies: per chart  Antibiotics: Anti-infectives     Start     Dose/Rate Route Frequency Ordered Stop   06/05/12 2000   cefoTEtan  (CEFOTAN) 2 g in dextrose 5 % 50 mL IVPB     Comments: Pharmacy may adjust dose strength for optimal dosing      2 g 100 mL/hr over 30 Minutes Intravenous  Once 06/05/12 1305 06/05/12 2117   06/05/12 0815   clindamycin (CLEOCIN) 900 mg, gentamicin (GARAMYCIN) 240 mg in sodium chloride 0.9 % 1,000 mL for intraperitoneal lavage         Intraperitoneal To Surgery 06/05/12 0755 06/06/12 0815   06/05/12 0600   cefoTEtan (CEFOTAN) 2 g in dextrose 5 % 50 mL IVPB        2 g 100 mL/hr over 30 Minutes Intravenous On call to O.R. 06/04/12 1812 06/05/12 0730

## 2012-06-08 LAB — GLUCOSE, CAPILLARY
Glucose-Capillary: 168 mg/dL — ABNORMAL HIGH (ref 70–99)
Glucose-Capillary: 126 mg/dL — ABNORMAL HIGH (ref 70–99)

## 2012-06-08 MED ORDER — OXYCODONE HCL 5 MG PO TABS
10.0000 mg | ORAL_TABLET | ORAL | Status: DC | PRN
  Administered 2012-06-08: 10 mg via ORAL
  Filled 2012-06-08: qty 2

## 2012-06-08 NOTE — Discharge Summary (Signed)
Physician Discharge Summary  Patient ID: William Barber MRN: [id number] DOB/AGE: May 29, 1961 51 y.o.  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  He was transferred to the floor in stable condition.  His diet was advanced slowly.  He began to have bowel movements by POD 2.  He tolerated foley removal and urinated without difficulty.  He was ambulating without difficulty.  His pain pump was removed on day of discharge and he was started on PO narcotics.  He tolerated these well and was determined to be in stable condition for discharge home.  He will follow up with Dr Michaell Cowing in office in a couple weeks.    Treatments: IV hydration, insulin: regular and surgery: laparoscopic LAR  Discharge Exam: Blood pressure 114/72, pulse 61, temperature 97.9 F (36.6 C), temperature source Oral, resp. rate 16, height 6' (1.829 m), weight 191 lb (86.637 kg), SpO2 99.00%. General appearance: alert, cooperative and no distress Resp: clear to auscultation bilaterally Cardio: regular rate and rhythm GI: soft, appropriately tender Extremities: extremities normal, atraumatic, no cyanosis or edema Neurologic: Grossly normal Incision/Wound:clean dry and intact  Disposition: Final discharge disposition not confirmed     Medication List     As of 06/08/2012  7:57 AM    TAKE these medications         acidophilus Caps   Take by mouth daily.      colestipol 1 G tablet   Commonly known as: COLESTID   Take 1 g by mouth at bedtime.      enalapril 20 MG tablet   Commonly known as: VASOTEC   Take 20 mg by mouth every morning.      hyoscyamine 0.375 MG 12 hr tablet   Commonly known as: LEVBID   Take 0.375 mg by mouth every 12 (twelve) hours as needed. For stomach cramps      metFORMIN 500 MG tablet   Commonly known as: GLUCOPHAGE   Take 500 mg by mouth daily with breakfast.      multivitamin with minerals Tabs   Take 1 tablet by mouth daily.      naproxen sodium 220 MG tablet   Commonly known as: ANAPROX   Take 220 mg by mouth 2 (two) times daily as needed. For pain      oxyCODONE 5 MG immediate release tablet   Commonly known as: Oxy IR/ROXICODONE   Take 1-2 tablets (5-10 mg total) by mouth every 4 (four) hours as needed for pain.      pravastatin 40 MG tablet   Commonly known as: PRAVACHOL   Take 40 mg by mouth daily.           Follow-up Information    Follow up with GROSS,STEVEN C., MD. Schedule an appointment as soon as possible for a visit in 2 weeks.   Contact information:   [address] [place] [address] [phone]          Signed: Vanita Panda 06/08/2012, 7:57 AM

## 2012-06-09 ENCOUNTER — Telehealth (INDEPENDENT_AMBULATORY_CARE_PROVIDER_SITE_OTHER): Payer: Self-pay | Admitting: General Surgery

## 2012-06-09 DIAGNOSIS — C19 Malignant neoplasm of rectosigmoid junction: Secondary | ICD-10-CM

## 2012-06-09 DIAGNOSIS — C189 Malignant neoplasm of colon, unspecified: Secondary | ICD-10-CM

## 2012-06-09 NOTE — Telephone Encounter (Signed)
Message copied by Liliana Cline on Mon Jun 09, 2012  4:28 PM ------      Message from: Ardeth Sportsman      Created: Mon Jun 09, 2012  4:01 PM       1.  Please set the patient up for GI Tumor Board.            Dx: T3N0 rectosigmoid cancer            Surgeon: Michaell Cowing      Gastroenterologist: Magod            2.  Please set up consult(s):            Med Onc: Yes:  Dr Truett Perna        Rad Onc: Yes

## 2012-06-09 NOTE — Telephone Encounter (Signed)
Referral made and faxed to [phone] attn: Tiffany. Awaiting appt.

## 2012-06-10 ENCOUNTER — Telehealth: Payer: Self-pay | Admitting: Oncology

## 2012-06-10 NOTE — Telephone Encounter (Signed)
C/D 06/10/12 for appt 06/23/12

## 2012-06-10 NOTE — Telephone Encounter (Signed)
S/W pt in re NP appt 10/14 @ 1:30 w/ Dr. Truett Perna. Referring Dr. Michaell Cowing Dx-Colon NP packet mailed.

## 2012-06-10 NOTE — Progress Notes (Signed)
Quick Note:  Called results to patient's wife. T3 N0 cancer. 0/31 lymph nodes. Hopefully a good prognosis. He is rather young and it was a large bulky tumor. I recommend to sit down and discuss with medical oncology. They have appointment to see Dr. Mancel Bale in two weeks. Same day has followup with me.  So far, the patient has done remarkably well. Used pain meds only for a few days. Walking well. Regular diet. Regular bowel movements. No longer having the crampy IBS-like symptoms and back pain that he struggled with for months. His wife and he are happy how things are going so far. Encourage them to gradually be more active but not overdo it. Please call if sooner issues arise. Questions were answered. She expressed understanding and appreciation. ______

## 2012-06-19 ENCOUNTER — Encounter (INDEPENDENT_AMBULATORY_CARE_PROVIDER_SITE_OTHER): Payer: Self-pay

## 2012-06-23 ENCOUNTER — Telehealth: Payer: Self-pay | Admitting: Oncology

## 2012-06-23 ENCOUNTER — Ambulatory Visit (INDEPENDENT_AMBULATORY_CARE_PROVIDER_SITE_OTHER): Payer: 59 | Admitting: Surgery

## 2012-06-23 ENCOUNTER — Other Ambulatory Visit: Payer: Self-pay | Admitting: *Deleted

## 2012-06-23 ENCOUNTER — Encounter (INDEPENDENT_AMBULATORY_CARE_PROVIDER_SITE_OTHER): Payer: Self-pay | Admitting: Surgery

## 2012-06-23 ENCOUNTER — Ambulatory Visit (HOSPITAL_BASED_OUTPATIENT_CLINIC_OR_DEPARTMENT_OTHER): Payer: 59 | Admitting: Oncology

## 2012-06-23 ENCOUNTER — Ambulatory Visit (HOSPITAL_BASED_OUTPATIENT_CLINIC_OR_DEPARTMENT_OTHER): Payer: 59

## 2012-06-23 ENCOUNTER — Encounter: Payer: Self-pay | Admitting: Oncology

## 2012-06-23 VITALS — BP 122/84 | HR 60 | Temp 97.6°F | Resp 16 | Ht 72.0 in | Wt 187.0 lb

## 2012-06-23 VITALS — BP 152/88 | HR 74 | Temp 98.2°F | Resp 20 | Ht 72.0 in | Wt 187.5 lb

## 2012-06-23 DIAGNOSIS — D649 Anemia, unspecified: Secondary | ICD-10-CM

## 2012-06-23 DIAGNOSIS — C19 Malignant neoplasm of rectosigmoid junction: Secondary | ICD-10-CM

## 2012-06-23 DIAGNOSIS — C189 Malignant neoplasm of colon, unspecified: Secondary | ICD-10-CM

## 2012-06-23 DIAGNOSIS — E7800 Pure hypercholesterolemia, unspecified: Secondary | ICD-10-CM

## 2012-06-23 DIAGNOSIS — C187 Malignant neoplasm of sigmoid colon: Secondary | ICD-10-CM

## 2012-06-23 DIAGNOSIS — K429 Umbilical hernia without obstruction or gangrene: Secondary | ICD-10-CM

## 2012-06-23 DIAGNOSIS — R970 Elevated carcinoembryonic antigen [CEA]: Secondary | ICD-10-CM

## 2012-06-23 NOTE — Telephone Encounter (Signed)
gve the pt his oct,nov 2013 appt calendar along with the ct scan appt
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
## 2012-06-23 NOTE — Telephone Encounter (Signed)
Pt decided to wait before setting up the appt with Britta Mccreedy the dietician.
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
## 2012-06-23 NOTE — Patient Instructions (Signed)
GETTING TO GOOD BOWEL HEALTH. Irregular bowel habits such as constipation and diarrhea can lead to many problems over time.  Having one soft bowel movement a day is the most important way to prevent further problems.  The anorectal canal is designed to handle stretching and feces to safely manage our ability to get rid of solid waste (feces, poop, stool) out of our body.  BUT, hard constipated stools can act like ripping concrete bricks and diarrhea can be a burning fire to this very sensitive area of our body, causing inflamed hemorrhoids, anal fissures, increasing risk is perirectal abscesses, abdominal pain/bloating, an making irritable bowel worse.     The goal: ONE SOFT BOWEL MOVEMENT A DAY!  To have soft, regular bowel movements:    Drink at least 8 tall glasses of water a day.     Take plenty of fiber.  Fiber is the undigested part of plant food that passes into the colon, acting s "natures broom" to encourage bowel motility and movement.  Fiber can absorb and hold large amounts of water. This results in a larger, bulkier stool, which is soft and easier to pass. Work gradually over several weeks up to 6 servings a day of fiber (25g a day even more if needed) in the form of: o Vegetables -- Root (potatoes, carrots, turnips), leafy green (lettuce, salad greens, celery, spinach), or cooked high residue (cabbage, broccoli, etc) o Fruit -- Fresh (unpeeled skin & pulp), Dried (prunes, apricots, cherries, etc ),  or stewed ( applesauce)  o Whole grain breads, pasta, etc (whole wheat)  o Bran cereals    Bulking Agents -- This type of water-retaining fiber generally is easily obtained each day by one of the following:  o Psyllium bran -- The psyllium plant is remarkable because its ground seeds can retain so much water. This product is available as Metamucil, Konsyl, Effersyllium, Per Diem Fiber, or the less expensive generic preparation in drug and health food stores. Although labeled a laxative, it really  is not a laxative.  o Methylcellulose -- This is another fiber derived from wood which also retains water. It is available as Citrucel. o Polyethylene Glycol - and "artificial" fiber commonly called Miralax or Glycolax.  It is helpful for people with gassy or bloated feelings with regular fiber o Flax Seed - a less gassy fiber than psyllium   No reading or other relaxing activity while on the toilet. If bowel movements take longer than 5 minutes, you are too constipated   AVOID CONSTIPATION.  High fiber and water intake usually takes care of this.  Sometimes a laxative is needed to stimulate more frequent bowel movements, but    Laxatives are not a good long-term solution as it can wear the colon out. o Osmotics (Milk of Magnesia, Fleets phosphosoda, Magnesium citrate, MiraLax, GoLytely) are safer than  o Stimulants (Senokot, Castor Oil, Dulcolax, Ex Lax)    o Do not take laxatives for more than 7days in a row.    IF SEVERELY CONSTIPATED, try a Bowel Retraining Program: o Do not use laxatives.  o Eat a diet high in roughage, such as bran cereals and leafy vegetables.  o Drink six (6) ounces of prune or apricot juice each morning.  o Eat two (2) large servings of stewed fruit each day.  o Take one (1) heaping tablespoon of a psyllium-based bulking agent twice a day. Use sugar-free sweetener when possible to avoid excessive calories.  o Eat a normal breakfast.  o   Set aside 15 minutes after breakfast to sit on the toilet, but do not strain to have a bowel movement.  o If you do not have a bowel movement by the third day, use an enema and repeat the above steps.    Controlling diarrhea o Switch to liquids and simpler foods for a few days to avoid stressing your intestines further. o Avoid dairy products (especially milk & ice cream) for a short time.  The intestines often can lose the ability to digest lactose when stressed. o Avoid foods that cause gassiness or bloating.  Typical foods include  beans and other legumes, cabbage, broccoli, and dairy foods.  Every person has some sensitivity to other foods, so listen to our body and avoid those foods that trigger problems for you. o Adding fiber (Citrucel, Metamucil, psyllium, Miralax) gradually can help thicken stools by absorbing excess fluid and retrain the intestines to act more normally.  Slowly increase the dose over a few weeks.  Too much fiber too soon can backfire and cause cramping & bloating. o Probiotics (such as active yogurt, Align, etc) may help repopulate the intestines and colon with normal bacteria and calm down a sensitive digestive tract.  Most studies show it to be of mild help, though, and such products can be costly. o Medicines:   Bismuth subsalicylate (ex. Kayopectate, Pepto Bismol) every 30 minutes for up to 6 doses can help control diarrhea.  Avoid if pregnant.   Loperamide (Immodium) can slow down diarrhea.  Start with two tablets (4mg total) first and then try one tablet every 6 hours.  Avoid if you are having fevers or severe pain.  If you are not better or start feeling worse, stop all medicines and call your doctor for advice o Call your doctor if you are getting worse or not better.  Sometimes further testing (cultures, endoscopy, X-ray studies, bloodwork, etc) may be needed to help diagnose and treat the cause of the diarrhea.  Colorectal Cancer Colorectal cancer is an abnormal growth of tissue (tumor) in the colon or rectum that is cancerous (malignant). Unlike noncancerous (benign) tumors, malignant tumors can spread to other parts of your body. The colon is the large bowel or large intestine. The rectum is the last several inches of the colon. CAUSES  The exact cause of colon cancer is unknown.  RISK FACTORS The majority of patients do not have identifiable risk factors, but the following factors may increase your chances of getting colon cancer:  Age. Most colorectal cancers occur in people older than 50  years.  Having abnormal growths (polyps) on the inner wall of the colon or rectum.  Diabetes.  Being African American.  Family history of hereditary nonpolyposis colon cancer. This condition is caused by changes in the genes that are responsible for repairing mismatched DNA.  Family history of familial adenomatous polyposis (FAP). This is a rare, inherited condition in which hundreds of polyps form in the colon and rectum. It is caused by a change in the APC gene. Unless FAP is treated, it usually leads to colorectal cancer by age 40.  Personal history of cancer. A person who has already had colorectal cancer may develop it a second time. Also, women with a history of ovarian, uterine, or breast cancer are at a somewhat higher risk of developing colorectal cancer.  Inflammatory bowel disease, including ulcerative colitis and Crohn's disease.  Being obese or eating a diet that is high in fat (especially animal fat) and low in fiber,   fruits, and vegetables.  Smoking. A person who smokes cigarettes may be at increased risk of developing polyps and colorectal cancer.  Heavy alcohol use. SYMPTOMS Early colorectal cancer often does not cause symptoms. As the cancer grows, symptoms may include:  Diarrhea.  Constipation.  Feeling like the bowel does not empty completely after a bowel movement.  Blood in the stool.  Stools that are narrower than usual.  Abdominal discomfort, pain, bloating, fullness, or cramps.  Unexplained weight loss.  Constant tiredness.  Nausea and vomiting. DIAGNOSIS  Your caregiver will ask about your medical history. He or she may also perform a number of procedures, such as:  A physical exam.  Blood tests. This may include a routine complete blood count and iron level testing. Your caregiver may also check for carcinoembryonic antigen (CEA) and other substances in the blood. Some people who have colorectal cancer have a high CEA level. These levels may be  used to follow the activity of your colon cancer.  Chest X-rays, computed tomography (CT) scans, or magnetic resonance imaging (MRI).  Taking a tissue sample (biopsy) from the colon or rectum. The sample is examined under a microscope to look for cancer cells.  Sigmoidoscopy. With this test, your caregiver can see inside your colon. A thin flexible tube (sigmoidoscope) is placed into your rectum. This device has a light source and a tiny video camera in it. Your caregiver uses the sigmoidoscope to look at the last third of your colon.  Colonoscopy. This test is like sigmoidoscopy, but your caregiver looks at the entire colon. This test usually requires medicine that helps you relax (sedative).  Endorectal ultrasound. With this test, your caregiver can see how deep a rectal tumor has grown and whether the cancer has spread to lymph nodes or other nearby tissues. A tool (probe) is inserted into the rectum. The probe sends out sound waves to the rectum and nearby tissues, and a computer uses the echoes to create a picture. Your cancer will be staged to determine its severity and extent. Staging is a careful attempt to find out the size of the tumor, whether the cancer has spread, and if so, to what parts of the body. You may need to have more tests to determine the stage of your cancer. The test results will help determine what treatment plan is best for you. STAGES  Stage 0. The cancer is found only in the innermost lining of the colon or rectum.  Stage I. The cancer has grown into the inner wall of the colon or rectum. The cancer has not yet reached the outer wall of the colon.  Stage II. The cancer extends more deeply into or through the wall of the colon or rectum. It may have invaded nearby tissue, but cancer cells have not spread to the lymph nodes.  Stage III. The cancer has spread to nearby lymph nodes but not to other parts of the body.  Stage IV. The cancer has spread to other parts of  the body, such as the liver or lungs. Your caregiver may tell you the detailed stage of your cancer. In that case, the stage will include both a number and a letter. TREATMENT  Depending on the type and stage, colorectal cancer may be treated with surgery, radiation therapy, or chemotherapy. Some patients have a combination of these therapies.  Surgery may be done to remove the polyps from your colon. In early stages, your caregiver may be able to do this during a colonoscopy.   In later stages, surgery may be done to remove part of your colon.  Radiation therapy uses high-energy rays to kill cancer cells. This is usually recommended for patients with rectal cancer.  Chemotherapy is the use of drugs to kill cancer cells. Caregivers also give chemotherapy to help reduce pain and other problems caused by colorectal cancer. This may be done even if the cancer is not curable. HOME CARE INSTRUCTIONS   Only take over-the-counter or prescription medicines for pain, discomfort, or fever as directed by your caregiver.  Maintain a healthy diet.  Consider joining a support group. This may help you learn to cope with the stress of having colorectal cancer.  Seek advice to help you manage treatment side effects.  Keep all follow-up appointments as directed by your caregiver.  Inform your cancer specialist if you are admitted to the hospital. SEEK IMMEDIATE MEDICAL CARE IF:   Your diarrhea or constipation does not go away.  You have alternating constipation and diarrhea.  You have blood in your stools.  Your abdominal pain gets worse.  You lose weight without trying.  You notice new fatigue or weakness.  You develop a fever during chemotherapy treatment. Document Released: 08/27/2005 Document Revised: 11/19/2011 Document Reviewed: 08/14/2011 [employer] Patient Information 2013 [employer].  

## 2012-06-23 NOTE — Progress Notes (Signed)
Checked in new patient. No financial issues. °

## 2012-06-24 ENCOUNTER — Encounter: Payer: Self-pay | Admitting: Radiation Oncology

## 2012-06-24 NOTE — Progress Notes (Signed)
Met with patient and wife and explained role of GI navigator.  Resource information on colon cancer and Xeloda were given, along with cancer center support information and contact phone numbers.  Referral was made to dietician for education.  Patient was appreciative and verbalized understanding.  Will continue to follow as needed.

## 2012-06-25 ENCOUNTER — Encounter: Payer: Self-pay | Admitting: *Deleted

## 2012-06-25 ENCOUNTER — Ambulatory Visit (HOSPITAL_COMMUNITY)
Admission: RE | Admit: 2012-06-25 | Discharge: 2012-06-25 | Disposition: A | Discharge status: Home / Self Care | Payer: 59 | Source: Ambulatory Visit | Attending: Oncology | Admitting: Oncology

## 2012-06-25 ENCOUNTER — Encounter: Payer: Self-pay | Admitting: Oncology

## 2012-06-25 ENCOUNTER — Ambulatory Visit
Admission: RE | Admit: 2012-06-25 | Discharge: 2012-06-25 | Disposition: A | Discharge status: Home / Self Care | Payer: 59 | Source: Ambulatory Visit | Attending: Radiation Oncology | Admitting: Radiation Oncology

## 2012-06-25 ENCOUNTER — Encounter: Payer: Self-pay | Admitting: Radiation Oncology

## 2012-06-25 ENCOUNTER — Other Ambulatory Visit: Payer: 59

## 2012-06-25 ENCOUNTER — Other Ambulatory Visit (HOSPITAL_BASED_OUTPATIENT_CLINIC_OR_DEPARTMENT_OTHER): Payer: 59 | Admitting: Lab

## 2012-06-25 ENCOUNTER — Other Ambulatory Visit: Payer: Self-pay | Admitting: *Deleted

## 2012-06-25 VITALS — BP 131/66 | HR 74 | Temp 98.7°F | Wt 189.1 lb

## 2012-06-25 DIAGNOSIS — C189 Malignant neoplasm of colon, unspecified: Secondary | ICD-10-CM

## 2012-06-25 DIAGNOSIS — E119 Type 2 diabetes mellitus without complications: Secondary | ICD-10-CM | POA: Insufficient documentation

## 2012-06-25 DIAGNOSIS — Z87891 Personal history of nicotine dependence: Secondary | ICD-10-CM | POA: Insufficient documentation

## 2012-06-25 DIAGNOSIS — Z79899 Other long term (current) drug therapy: Secondary | ICD-10-CM | POA: Insufficient documentation

## 2012-06-25 DIAGNOSIS — E7800 Pure hypercholesterolemia, unspecified: Secondary | ICD-10-CM | POA: Insufficient documentation

## 2012-06-25 DIAGNOSIS — I10 Essential (primary) hypertension: Secondary | ICD-10-CM | POA: Insufficient documentation

## 2012-06-25 DIAGNOSIS — C187 Malignant neoplasm of sigmoid colon: Secondary | ICD-10-CM | POA: Insufficient documentation

## 2012-06-25 DIAGNOSIS — R918 Other nonspecific abnormal finding of lung field: Secondary | ICD-10-CM | POA: Insufficient documentation

## 2012-06-25 HISTORY — DX: Malignant neoplasm of colon, unspecified: C18.9

## 2012-06-25 LAB — COMPREHENSIVE METABOLIC PANEL (CC13)
ALT: 55 U/L (ref 0–55)
AST: 26 U/L (ref 5–34)
Albumin: 3.6 g/dL (ref 3.5–5.0)
CO2: 27 mEq/L (ref 22–29)
Calcium: 9.6 mg/dL (ref 8.4–10.4)
Chloride: 105 mEq/L (ref 98–107)
Creatinine: 1.1 mg/dL (ref 0.7–1.3)
Potassium: 4.2 mEq/L (ref 3.5–5.1)
Sodium: 138 mEq/L (ref 136–145)
Total Protein: 7.7 g/dL (ref 6.4–8.3)

## 2012-06-25 LAB — CBC WITH DIFFERENTIAL/PLATELET
BASO%: 1 % (ref 0.0–2.0)
HCT: 32.3 % — ABNORMAL LOW (ref 38.4–49.9)
MCHC: 32.3 g/dL (ref 32.0–36.0)
MONO#: 0.6 10*3/uL (ref 0.1–0.9)
NEUT%: 42.6 % (ref 39.0–75.0)
RDW: 15.7 % — ABNORMAL HIGH (ref 11.0–14.6)
WBC: 4.8 10*3/uL (ref 4.0–10.3)
lymph#: 1.9 10*3/uL (ref 0.9–3.3)

## 2012-06-25 LAB — CEA: CEA: 0.6 ng/mL (ref 0.0–5.0)

## 2012-06-25 MED ORDER — CAPECITABINE 500 MG PO TABS: 2000.0000 mg | ORAL_TABLET | Freq: Two times a day (BID) | ORAL | Status: DC

## 2012-06-25 MED ORDER — PROCHLORPERAZINE MALEATE 10 MG PO TABS: 10.0000 mg | ORAL_TABLET | Freq: Four times a day (QID) | ORAL | Status: DC | PRN

## 2012-06-25 NOTE — Progress Notes (Signed)
Faxed xeloda prescription to [facility] °

## 2012-06-25 NOTE — Addendum Note (Signed)
Encounter addended by: Delynn Flavin, RN on: 06/25/2012  6:09 PM<BR>     Documentation filed: Charges VN

## 2012-06-25 NOTE — Progress Notes (Signed)
[facility] visit for assessment regarding new Dx. Of colon cancer.  William Barber reports that he has been seen by Dr. Mancel Bale, Medical Oncologist.  Denies any pain today.  Weight 270 3 years ago.  Weight prior to diagnosis 215 -216.  Given go-ahead to eat his normal diet. Accompanied by his spouse, William Barber.

## 2012-06-25 NOTE — Progress Notes (Signed)
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   HISTORY OF PRESENT ILLNESS::William Barber is a 51 y.o. male who is seen for an initial consultation visit. The patient initially had some difficulties with abdominal bloating and loose stools for several years. The patient noticed some increased difficulties with his diet. This primarily was related to cheese and had the cream but he states that over the last year more foods tended to bother him. This was primarily at night. These symptoms led to further workup and he underwent a colonoscopy. A malignant appearing mass which was partially obstructing was seen in the distal sigmoid colon. A biopsy revealed an adenomatous polyp with high-grade dysplasia.  A CT scan of the abdomen and pelvis revealed a subcentimeter lesion in the hepatic dome which was too small to characterize. No clear evidence of distant metastatic disease. There was a masslike circumferential thickening of the sigmoid colon which was noted in addition to some small adjacent lymph nodes.  The patient proceeded to undergo surgical resection on 06/05/2012. 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 No angiolymphatic invasion was seen. 31 lymph nodes were removed, none demonstrating carcinoma. The resection margins were negative with the distal margin being 6 cm. The mesenteric margin was at 5 cm. This represented a T3, N0, M0 tumor.  The patient has been doing well postoperatively. He has done well in terms of pain and recovering. He also has noticed that many of his prior symptoms have resolved which is very pleased with. The patient therefore presents today for consideration  of possible radiotherapy as part of his overall treatment plan. The patient has seen Dr. Truett Perna and they have tentatively made a plan to proceed with adjuvant Xeloda.   PREVIOUS RADIATION THERAPY: No   PAST MEDICAL HISTORY:  has a past medical history of Hypertension; Night sweats; Weight loss; Colon polyp; IBS (irritable bowel syndrome); Wears glasses; Hypercholesterolemia (06/06/2012); Liver mass on CT Sep2013 (05/19/2012); Colon cancer (06/05/12); IBS (irritable bowel syndrome); and Diabetes mellitus.     PAST SURGICAL HISTORY: Past Surgical History  Procedure Date  . Knee surgery 1987    Left  . Umbilical hernia repair 06/05/2012    Procedure: HERNIA REPAIR UMBILICAL ADULT;  Surgeon: Ardeth Sportsman, MD;  Location: [facility];  Service: General;  Laterality: N/A;  . Proctoscopy 06/05/2012    Procedure: PROCTOSCOPY;  Surgeon: Ardeth Sportsman, MD;  Location: [facility];  Service: General;;  rigid proctoscopy   . Laparoscopic incisional / umbilical / ventral hernia repair 06/05/12    DR.Groat,  low anterior resection  . Colonoscopy w/ biopsies 05/09/12    mass distal sigmoid colon lesion bx'd     FAMILY HISTORY: family history includes Cancer in his paternal grandfather and sister.   SOCIAL HISTORY:  reports that he quit smoking about 27 years ago. His smoking use included Cigarettes. He has never used smokeless tobacco. He reports that he drinks alcohol. He reports that he does not use illicit drugs.   ALLERGIES: Hydromorphone hcl; Iodinated diagnostic agents; and Morphine and related   MEDICATIONS:  Current Outpatient Prescriptions  Medication Sig Dispense Refill  . colestipol (COLESTID) 1 G tablet Take 1 g by mouth at bedtime.       Marland Kitchen  enalapril (VASOTEC) 20 MG tablet Take 20 mg by mouth every morning.       . metFORMIN (GLUCOPHAGE) 500 MG tablet Take 500 mg by mouth daily with breakfast.       . Multiple Vitamin (MULTIVITAMIN WITH MINERALS) TABS Take 1 tablet by mouth daily.      .  pravastatin (PRAVACHOL) 40 MG tablet Take 40 mg by mouth daily.      . Probiotic Product (PROBIOTIC DAILY PO) Take 1 capsule by mouth daily.      Marland Kitchen oxyCODONE (OXY IR/ROXICODONE) 5 MG immediate release tablet          REVIEW OF SYSTEMS:  A 15 point review of systems is documented in the electronic medical record. This was obtained by the nursing staff. However, I reviewed this with the patient to discuss relevant findings and make appropriate changes.  Pertinent items are noted in HPI.    PHYSICAL EXAM:  weight is 189 lb 1.6 oz (85.775 kg). His temperature is 98.7 F (37.1 C). His blood pressure is 131/66 and his pulse is 74.   General: Well-developed, in no acute distress HEENT: Normocephalic, atraumatic; oral cavity clear Neck: Supple without any lymphadenopathy Cardiovascular: Regular rate and rhythm Respiratory: Clear to auscultation bilaterally GI: Soft, nontender, normal bowel sounds Extremities: No edema present Neuro: No focal deficits    LABORATORY DATA:  Lab Results  Component Value Date   WBC 4.8 06/25/2012   HGB 10.4* 06/25/2012   HCT 32.3* 06/25/2012   MCV 82.4 06/25/2012   PLT 423* 06/25/2012   Lab Results  Component Value Date   NA 138 06/25/2012   K 4.2 06/25/2012   CL 105 06/25/2012   CO2 27 06/25/2012   Lab Results  Component Value Date   ALT 55 06/25/2012   AST 26 06/25/2012   ALKPHOS 68 06/25/2012   BILITOT 0.60 06/25/2012      RADIOGRAPHY: No results found.     IMPRESSION: 51 year old male status post resection of a T3, N0, M0 cancer of the distal sigmoid colon. The patient's case was discussed at multidisciplinary GI conference this morning. I been a chance to review personally his films and pathology report in detail and we discussed his case including the intraoperative findings. This represents a distal sigmoid colon which extended to the peritoneal reflection. In reviewing his case there is not significant concern with regards to local  control. I do not believe for his case adjuvant radiotherapy would provide significant benefit and I discussed this with him. At conference, the consensus was that proceeding with adjuvant chemotherapy would be appropriate and this has been set up already. The patient and his wife had several questions which were answered. They are agreeable to the above plan.   PLAN: The patient will followup when necessary.       ________________________________   Radene Gunning, MD, PhD

## 2012-06-26 ENCOUNTER — Telehealth: Payer: Self-pay | Admitting: *Deleted

## 2012-06-26 DIAGNOSIS — D649 Anemia, unspecified: Secondary | ICD-10-CM

## 2012-06-26 MED ORDER — FERROUS SULFATE 325 (65 FE) MG PO TBEC: 325.0000 mg | DELAYED_RELEASE_TABLET | Freq: Two times a day (BID) | ORAL | Status: DC

## 2012-06-26 NOTE — Telephone Encounter (Signed)
Notified of normal CEA results. MD suggests trying ferrous sulfate 325 mg bid. He will try. Suggested he take stool softener and/or MiraLax as needed.

## 2012-06-26 NOTE — Telephone Encounter (Signed)
Message copied by Wandalee Ferdinand on Thu Jun 26, 2012  5:19 PM ------      Message from: Thornton Papas B      Created: Thu Jun 26, 2012  7:19 AM       Please call patient.cea is now normal, start ferrous sulfate 325mg  twice daily

## 2012-06-27 NOTE — Addendum Note (Signed)
Encounter addended by: Alba Kriesel Mintz Ferdinand Revoir, RN on: 06/27/2012 12:31 PM<BR>     Documentation filed: Charges VN

## 2012-06-27 NOTE — Telephone Encounter (Signed)
RECEIVED A FAX FROM [facility] CONCERNING A CONFIRMATION OF PRESCRIPTION SHIPMENT FOR XELODA ON 06/26/12. 

## 2012-07-17 ENCOUNTER — Ambulatory Visit (HOSPITAL_BASED_OUTPATIENT_CLINIC_OR_DEPARTMENT_OTHER): Payer: 59 | Admitting: Oncology

## 2012-07-17 ENCOUNTER — Ambulatory Visit (HOSPITAL_BASED_OUTPATIENT_CLINIC_OR_DEPARTMENT_OTHER): Payer: 59 | Admitting: Lab

## 2012-07-17 ENCOUNTER — Telehealth: Payer: Self-pay | Admitting: Oncology

## 2012-07-17 VITALS — BP 134/75 | HR 73 | Temp 98.0°F | Resp 20 | Ht 72.0 in | Wt 200.8 lb

## 2012-07-17 DIAGNOSIS — K7689 Other specified diseases of liver: Secondary | ICD-10-CM

## 2012-07-17 DIAGNOSIS — C19 Malignant neoplasm of rectosigmoid junction: Secondary | ICD-10-CM

## 2012-07-17 DIAGNOSIS — C187 Malignant neoplasm of sigmoid colon: Secondary | ICD-10-CM

## 2012-07-17 DIAGNOSIS — J984 Other disorders of lung: Secondary | ICD-10-CM

## 2012-07-17 DIAGNOSIS — D649 Anemia, unspecified: Secondary | ICD-10-CM

## 2012-07-17 LAB — CBC WITH DIFFERENTIAL/PLATELET
BASO%: 0.9 % (ref 0.0–2.0)
EOS%: 3 % (ref 0.0–7.0)
MCH: 28.8 pg (ref 27.2–33.4)
MCHC: 33.6 g/dL (ref 32.0–36.0)
MONO#: 0.5 10*3/uL (ref 0.1–0.9)
RBC: 3.52 10*6/uL — ABNORMAL LOW (ref 4.20–5.82)
WBC: 4.9 10*3/uL (ref 4.0–10.3)
lymph#: 2.1 10*3/uL (ref 0.9–3.3)

## 2012-07-17 NOTE — Progress Notes (Signed)
   [facility]    OFFICE PROGRESS NOTE   INTERVAL HISTORY:   He returns as scheduled. He began a first cycle of Xeloda on 06/30/2012. No nausea, diarrhea, or hand/foot pain. Mild soreness in the mouth without ulcers. He is eating a normal diet.   A staging chest CT on 06/25/2012 revealed a 3.1 mm subpleural nodule in the left lower lobe and a faint groundglass nodular in the right upper lobe. No adenopathy. No effusion. Stable too small to characterize liver lesions.  Objective:  Vital signs in last 24 hours:  Blood pressure 134/75, pulse 73, temperature 98 F (36.7 C), temperature source Oral, resp. rate 20, height 6' (1.829 m), weight 200 lb 12.8 oz (91.082 kg).    HEENT: No thrush or ulcers Resp: Lungs clear bilaterally Cardio: Regular rate and rhythm GI: Nontender, no hepatomegaly Vascular: No leg edema Skin: Dryness of the skin at the hands. No erythema or skin breakdown.    Lab Results:  Lab Results  Component Value Date   WBC 4.9 07/17/2012   HGB 10.1* 07/17/2012   HCT 30.2* 07/17/2012   MCV 85.7 07/17/2012   PLT 360 07/17/2012   ANC 2.1    Medications: I have reviewed the patient's current medications.  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 He began adjuvant Xeloda on 06/30/2012. 2. Elevated preoperative CEA , normal on 06/25/2012. 3. Subcentimeter indeterminate hepatic dome lesion on a CT 05/14/2012  4. Diabetes  5. Anemia-likely iron deficiency , stable he has not started iron replacement 6. Hypercholesterolemia  7. Hypertension  8. Tiny nodular lung lesions noted on the staging CT 06/23/2012-likely benign, we will consider obtaining a restaging CT in 6-9 months.  Disposition:  He tolerated the first cycle of Xeloda without significant acute toxicity. He will begin cycle 2 on 07/21/2012. Mr. Tutterow will  return for an office visit on 08/06/2012.   Thornton Papas, MD  07/17/2012  11:43 AM

## 2012-07-17 NOTE — Progress Notes (Signed)
Home glucose monitoring runs 118-140. Reports mild tenderness in mouth and fingertips. Some darkening of moles.

## 2012-07-17 NOTE — Telephone Encounter (Signed)
appts made and printed for pt aom °

## 2012-07-18 ENCOUNTER — Encounter: Payer: Self-pay | Admitting: *Deleted

## 2012-07-18 NOTE — Progress Notes (Signed)
RECEIVED A FAX FROM [facility] CONCERNING A CONFIRMATION OF PRESCRIPTION SHIPMENT FOR XELODA ON 07/17/12. 

## 2012-07-21 ENCOUNTER — Encounter (INDEPENDENT_AMBULATORY_CARE_PROVIDER_SITE_OTHER): Payer: 59 | Admitting: Surgery

## 2012-08-04 ENCOUNTER — Telehealth: Payer: Self-pay | Admitting: *Deleted

## 2012-08-04 NOTE — Telephone Encounter (Signed)
Patient agreed to move 11/27 appt. To 11/26 at 0945 at request of MD/

## 2012-08-05 ENCOUNTER — Ambulatory Visit (HOSPITAL_BASED_OUTPATIENT_CLINIC_OR_DEPARTMENT_OTHER): Payer: 59 | Admitting: Oncology

## 2012-08-05 ENCOUNTER — Telehealth: Payer: Self-pay | Admitting: Oncology

## 2012-08-05 ENCOUNTER — Other Ambulatory Visit (HOSPITAL_BASED_OUTPATIENT_CLINIC_OR_DEPARTMENT_OTHER): Payer: 59 | Admitting: Lab

## 2012-08-05 VITALS — BP 119/76 | HR 66 | Temp 99.0°F | Resp 20 | Ht 72.0 in | Wt 207.3 lb

## 2012-08-05 DIAGNOSIS — C187 Malignant neoplasm of sigmoid colon: Secondary | ICD-10-CM

## 2012-08-05 DIAGNOSIS — C19 Malignant neoplasm of rectosigmoid junction: Secondary | ICD-10-CM

## 2012-08-05 DIAGNOSIS — K7689 Other specified diseases of liver: Secondary | ICD-10-CM

## 2012-08-05 DIAGNOSIS — D649 Anemia, unspecified: Secondary | ICD-10-CM

## 2012-08-05 DIAGNOSIS — L270 Generalized skin eruption due to drugs and medicaments taken internally: Secondary | ICD-10-CM

## 2012-08-05 LAB — CBC WITH DIFFERENTIAL/PLATELET
BASO%: 0.6 % (ref 0.0–2.0)
Basophils Absolute: 0 10*3/uL (ref 0.0–0.1)
EOS%: 2.8 % (ref 0.0–7.0)
HCT: 32.2 % — ABNORMAL LOW (ref 38.4–49.9)
HGB: 10.6 g/dL — ABNORMAL LOW (ref 13.0–17.1)
MCH: 29.4 pg (ref 27.2–33.4)
MCHC: 33 g/dL (ref 32.0–36.0)
MONO#: 0.3 10*3/uL (ref 0.1–0.9)
NEUT%: 55.5 % (ref 39.0–75.0)
RDW: 16.7 % — ABNORMAL HIGH (ref 11.0–14.6)
WBC: 4.2 10*3/uL (ref 4.0–10.3)
lymph#: 1.4 10*3/uL (ref 0.9–3.3)

## 2012-08-05 LAB — COMPREHENSIVE METABOLIC PANEL (CC13)
ALT: 20 U/L (ref 0–55)
AST: 20 U/L (ref 5–34)
Alkaline Phosphatase: 72 U/L (ref 40–150)
Sodium: 135 mEq/L — ABNORMAL LOW (ref 136–145)
Total Bilirubin: 1.12 mg/dL (ref 0.20–1.20)
Total Protein: 7.6 g/dL (ref 6.4–8.3)

## 2012-08-05 NOTE — Progress Notes (Signed)
   [facility]    OFFICE PROGRESS NOTE   INTERVAL HISTORY:   He returns as scheduled. He began a second cycle of Xeloda on 07/21/2012. No nausea or diarrhea. He has developed superficial desquamation at the inner lips. No mouth ulcers. He has developed skin thickening and mild discomfort at the hands and feet. There is an area of superficial ulceration near the nail bed of the right thumb.  Objective:  Vital signs in last 24 hours:  Blood pressure 119/76, pulse 66, temperature 99 F (37.2 C), temperature source Oral, resp. rate 20, height 6' (1.829 m), weight 207 lb 4.8 oz (94.031 kg).    HEENT: No thrush or ulcerations. Mild dry desquamation at the lips Resp: Lungs clear bilaterally Cardio: Regular rate and rhythm GI: No hepatosplenomegaly, nontender Vascular: No leg edema  Skin: Skin thickening with dryness at the palms and soles. No erythema. Small linear ulcer near the lateral nail bed of the right thumb   Lab Results:  Lab Results  Component Value Date   WBC 4.2 08/05/2012   HGB 10.6* 08/05/2012   HCT 32.2* 08/05/2012   MCV 88.9 08/05/2012   PLT 269 08/05/2012   ANC 2.1    Medications: I have reviewed the patient's current medications.  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 He began adjuvant Xeloda on 06/30/2012.  2. Elevated preoperative CEA , normal on 06/25/2012.  3. Subcentimeter indeterminate hepatic dome lesion on a CT 05/14/2012  4. Diabetes  5. Anemia-likely iron deficiency , stable he has not started iron replacement  6. Hypercholesterolemia  7. Hypertension  8. Tiny nodular lung lesions noted on the staging CT 06/23/2012-likely benign, we will consider obtaining a restaging CT in 6-9 months.  9. 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  Disposition:  He has developed early hand/foot syndrome. The Xeloda will be dose reduced with cycle 3 beginning on 08/11/2012. He will discontinue the Xeloda and contact us if the hand/foot symptoms progress. He will use Neosporin on the ulcerated area near the right thumb. He will contact us if this lesion worsens.  Mr. Busche will return for an office visit on 08/27/2012.   Thornton Papas, MD  08/05/2012  4:58 PM

## 2012-08-05 NOTE — Patient Instructions (Signed)
Decrease Xeloda dose to 1500 mg ( #3 tablets) twice daily for 14 days on, 7 day rest.

## 2012-08-05 NOTE — Telephone Encounter (Signed)
appts made and printed for pt aom °

## 2012-08-06 ENCOUNTER — Ambulatory Visit: Payer: 59 | Admitting: Oncology

## 2012-08-06 ENCOUNTER — Telehealth: Payer: Self-pay | Admitting: *Deleted

## 2012-08-06 NOTE — Telephone Encounter (Signed)
Received confirmation from [employer] that Xeloda have been shipped to pt on 08/05/12.

## 2012-08-22 ENCOUNTER — Other Ambulatory Visit: Payer: Self-pay | Admitting: *Deleted

## 2012-08-22 NOTE — Telephone Encounter (Signed)
THIS REFILL REQUEST FOR XELODA WAS GIVEN TO DR.SHERRILL'S NURSE, TANYA WHITLOCK,RN. 

## 2012-08-25 ENCOUNTER — Other Ambulatory Visit: Payer: Self-pay | Admitting: *Deleted

## 2012-08-25 DIAGNOSIS — C189 Malignant neoplasm of colon, unspecified: Secondary | ICD-10-CM

## 2012-08-25 NOTE — Telephone Encounter (Signed)
THIS REFILL REQUEST FOR XELODA WAS GIVEN TO DR.SHERRILL'S NURSE, SUSAN COWARD,RN. 

## 2012-08-26 MED ORDER — CAPECITABINE 500 MG PO TABS: 1500.0000 mg | ORAL_TABLET | Freq: Two times a day (BID) | ORAL | Status: DC

## 2012-08-26 NOTE — Telephone Encounter (Signed)
SEE NOTE OF 08/25/12.

## 2012-08-26 NOTE — Addendum Note (Signed)
Addended by: Arvilla Meres on: 08/26/2012 12:27 PM   Modules accepted: Orders

## 2012-08-26 NOTE — Telephone Encounter (Signed)
RECEIVED A FAX FROM [employer] CONCERNING A CONFIRMATION OF FACSIMILE RECEIPT FOR PT. REFERRAL. 

## 2012-08-27 ENCOUNTER — Other Ambulatory Visit: Payer: Self-pay | Admitting: *Deleted

## 2012-08-27 ENCOUNTER — Other Ambulatory Visit (HOSPITAL_BASED_OUTPATIENT_CLINIC_OR_DEPARTMENT_OTHER): Payer: 59 | Admitting: Lab

## 2012-08-27 ENCOUNTER — Ambulatory Visit (HOSPITAL_BASED_OUTPATIENT_CLINIC_OR_DEPARTMENT_OTHER): Payer: 59 | Admitting: Oncology

## 2012-08-27 ENCOUNTER — Telehealth: Payer: Self-pay | Admitting: Oncology

## 2012-08-27 VITALS — BP 126/82 | HR 74 | Temp 98.4°F | Resp 20 | Ht 72.0 in | Wt 217.5 lb

## 2012-08-27 DIAGNOSIS — C187 Malignant neoplasm of sigmoid colon: Secondary | ICD-10-CM

## 2012-08-27 DIAGNOSIS — L270 Generalized skin eruption due to drugs and medicaments taken internally: Secondary | ICD-10-CM

## 2012-08-27 DIAGNOSIS — R911 Solitary pulmonary nodule: Secondary | ICD-10-CM

## 2012-08-27 DIAGNOSIS — C19 Malignant neoplasm of rectosigmoid junction: Secondary | ICD-10-CM

## 2012-08-27 DIAGNOSIS — D649 Anemia, unspecified: Secondary | ICD-10-CM

## 2012-08-27 LAB — CBC WITH DIFFERENTIAL/PLATELET
BASO%: 1.2 % (ref 0.0–2.0)
Eosinophils Absolute: 0.1 10*3/uL (ref 0.0–0.5)
LYMPH%: 37.6 % (ref 14.0–49.0)
MCHC: 33.9 g/dL (ref 32.0–36.0)
MCV: 92.7 fL (ref 79.3–98.0)
MONO%: 15.9 % — ABNORMAL HIGH (ref 0.0–14.0)
Platelets: 237 10*3/uL (ref 140–400)
RBC: 3.55 10*6/uL — ABNORMAL LOW (ref 4.20–5.82)

## 2012-08-27 NOTE — Telephone Encounter (Signed)
gv and printed appt schedule for pt for Jan 2014. °

## 2012-08-27 NOTE — Progress Notes (Signed)
   [facility]    OFFICE PROGRESS NOTE   INTERVAL HISTORY:   He returns as scheduled. He completed another cycle of Xeloda on 08/24/2012. He noted decreased foot pain and no mouth ulcers with the reduced dose of Xeloda. He continues to have hyperpigmentation and skin thickening at the hands. He uses cream and gloves. He is able to work. No nausea or diarrhea.  Objective:  Vital signs in last 24 hours:  Blood pressure 126/82, pulse 74, temperature 98.4 F (36.9 C), temperature source Oral, resp. rate 20, height 6' (1.829 m), weight 217 lb 8 oz (98.657 kg).    HEENT: No thrush or ulcers. There is hyperpigmentation over the tongue and mucosa. Resp: Lungs clear bilaterally Cardio: Regular rate and rhythm GI: No hepatomegaly, nontender Vascular: No leg edema  Skin: Hyperpigmentation, skin thickening, and superficial desquamation over the hands. Dryness and superficial desquamation at the soles. No discrete ulcers.   Lab Results:  Lab Results  Component Value Date   WBC 4.7 08/27/2012   HGB 11.2* 08/27/2012   HCT 32.9* 08/27/2012   MCV 92.7 08/27/2012   PLT 237 08/27/2012   ANC 2.0    Medications: I have reviewed the patient's current medications.  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 He began adjuvant Xeloda on 06/30/2012. He began cycle 3 with a dose reduction on 08/11/2012. 2. Elevated preoperative CEA , normal on 06/25/2012.  3. Subcentimeter indeterminate hepatic dome lesion on a CT 05/14/2012  4. Diabetes  5. Anemia-likely iron deficiency , stable he has not started iron replacement , improved 6. Hypercholesterolemia  7. Hypertension  8. Tiny nodular lung lesions noted on the staging CT 06/23/2012-likely benign, we will consider obtaining a restaging CT in 6-9 months.  9. 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   Disposition:  He appears stable. The hand/foot syndrome appears partially improved with the dose reduction of Xeloda. He will begin cycle 4 on 09/01/2012. Mr. Whitebread will discontinue Xeloda and contact us if he develops increased hand or foot pain. He will return for an office visit in 3 weeks.  Thornton Papas, MD  08/27/2012  2:27 PM

## 2012-08-27 NOTE — Telephone Encounter (Signed)
Rec'd fax that Xeloda shipped on 08/26/12 for next day delivery.

## 2012-09-02 ENCOUNTER — Telehealth: Payer: Self-pay | Admitting: *Deleted

## 2012-09-02 NOTE — Telephone Encounter (Signed)
Reports patient at work and his joints are feeling stiff today. Even walking a little with slight limp due to ache/stiffness. They did go away for weekend and he was sitting in car for extended period of time. Asking if this could be related to his Xeloda? He has no fever and she denies he has any weakness or numbness. Instructed wife to have him take OTC Ibuprofen and call us or PCP for fever or s/s of flu. Be sure to call if he does develop any neuro changes, although this would be very rare.

## 2012-09-18 ENCOUNTER — Encounter: Payer: Self-pay | Admitting: *Deleted

## 2012-09-18 ENCOUNTER — Telehealth: Payer: Self-pay | Admitting: Oncology

## 2012-09-18 ENCOUNTER — Ambulatory Visit (HOSPITAL_BASED_OUTPATIENT_CLINIC_OR_DEPARTMENT_OTHER): Payer: 59 | Admitting: Nurse Practitioner

## 2012-09-18 ENCOUNTER — Other Ambulatory Visit (HOSPITAL_BASED_OUTPATIENT_CLINIC_OR_DEPARTMENT_OTHER): Payer: 59

## 2012-09-18 VITALS — BP 148/93 | HR 71 | Temp 98.2°F | Resp 20 | Ht 72.0 in | Wt 221.5 lb

## 2012-09-18 DIAGNOSIS — R918 Other nonspecific abnormal finding of lung field: Secondary | ICD-10-CM

## 2012-09-18 DIAGNOSIS — C19 Malignant neoplasm of rectosigmoid junction: Secondary | ICD-10-CM

## 2012-09-18 DIAGNOSIS — C187 Malignant neoplasm of sigmoid colon: Secondary | ICD-10-CM

## 2012-09-18 DIAGNOSIS — L270 Generalized skin eruption due to drugs and medicaments taken internally: Secondary | ICD-10-CM

## 2012-09-18 DIAGNOSIS — D649 Anemia, unspecified: Secondary | ICD-10-CM

## 2012-09-18 LAB — COMPREHENSIVE METABOLIC PANEL (CC13)
AST: 22 U/L (ref 5–34)
Alkaline Phosphatase: 68 U/L (ref 40–150)
BUN: 17 mg/dL (ref 7.0–26.0)
CO2: 27 mEq/L (ref 22–29)
Calcium: 9.3 mg/dL (ref 8.4–10.4)
Chloride: 102 mEq/L (ref 98–107)
Glucose: 250 mg/dl — ABNORMAL HIGH (ref 70–99)
Potassium: 4 mEq/L (ref 3.5–5.1)
Total Bilirubin: 1.5 mg/dL — ABNORMAL HIGH (ref 0.20–1.20)

## 2012-09-18 LAB — CBC WITH DIFFERENTIAL/PLATELET
BASO%: 0.8 % (ref 0.0–2.0)
EOS%: 3.4 % (ref 0.0–7.0)
LYMPH%: 37 % (ref 14.0–49.0)
MCHC: 34.5 g/dL (ref 32.0–36.0)
MONO#: 0.5 10*3/uL (ref 0.1–0.9)
MONO%: 13.3 % (ref 0.0–14.0)
Platelets: 207 10*3/uL (ref 140–400)
RBC: 3.53 10*6/uL — ABNORMAL LOW (ref 4.20–5.82)
WBC: 4 10*3/uL (ref 4.0–10.3)

## 2012-09-18 NOTE — Telephone Encounter (Signed)
appts made and printed for pt aom °

## 2012-09-18 NOTE — Progress Notes (Signed)
RECEIVED A FAX FROM [facility] CONCERNING A CONFIRMATION OF PRESCRIPTION SHIPMENT FOR XELODA ON 09/17/12.

## 2012-09-18 NOTE — Progress Notes (Signed)
OFFICE PROGRESS NOTE  Interval history:  Mr. William Barber returns as scheduled. He completed cycle 4 adjuvant Xeloda beginning 09/01/2012. He denies nausea/vomiting. Mouth sores "come and go". No diarrhea. Hands and feet are better. He continues to note hyperpigmentation and dry skin. He denies foot pain. He has mild pain over the hands.   Objective: Blood pressure 148/93, pulse 71, temperature 98.2 F (36.8 C), temperature source Oral, resp. rate 20, height 6' (1.829 m), weight 221 lb 8 oz (100.472 kg).  Oropharynx is without thrush or ulceration. Scattered areas of hyperpigmentation over the tongue and mucosa. Lungs are clear. No wheezes or rales. Regular cardiac rhythm. Abdomen is soft and nontender. No hepatomegaly. Extremities are without edema. Superficial dry desquamation over the hands. Scattered areas of hyperpigmentation over the hands.  Lab Results: Lab Results  Component Value Date   WBC 4.0 09/18/2012   HGB 11.6* 09/18/2012   HCT 33.6* 09/18/2012   MCV 95.1 09/18/2012   PLT 207 09/18/2012    Chemistry:    Chemistry      Component Value Date/Time   NA 136 09/18/2012 0837   NA 135 06/06/2012 0430   K 4.0 09/18/2012 0837   K 4.0 06/06/2012 0430   CL 102 09/18/2012 0837   CL 99 06/06/2012 0430   CO2 27 09/18/2012 0837   CO2 29 06/06/2012 0430   BUN 17.0 09/18/2012 0837   BUN 9 06/06/2012 0430   CREATININE 1.3 09/18/2012 0837   CREATININE 1.20 06/06/2012 0430      Component Value Date/Time   CALCIUM 9.3 09/18/2012 0837   CALCIUM 8.6 06/06/2012 0430   ALKPHOS 68 09/18/2012 0837   AST 22 09/18/2012 0837   ALT 16 09/18/2012 0837   BILITOT 1.50* 09/18/2012 0837       Studies/Results: No results found.  Medications: I have reviewed the patient's current medications.  Assessment/Plan:  1. 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 He began adjuvant Xeloda on 06/30/2012. He began cycle 3 with a  dose reduction on 08/11/2012. He completed cycle 4 on 09/01/2012. 2. Elevated preoperative CEA. Normal on 06/25/2012. 3. Subcentimeter indeterminate hepatic dome lesion on CT 05/14/2012. 4. Diabetes. 5. Anemia, likely iron deficiency. Improved. 6. Hypercholesterolemia. 7. Hypertension. Blood pressure noted to be up from baseline today. He reports running out of his blood pressure medication. He plans on obtaining a refill today. 8. Tiny nodular lung lesions noted on a staging CT 06/23/2012. Likely benign.Restaging CT in 6-9 months will be considered. 9. Hand-foot syndrome secondary to Xeloda. The Xeloda was dose reduced to 1500 mg twice daily beginning with cycle 3 on 08/11/2012. Symptoms are stable.  Disposition-William Barber appears stable. He has completed 4 cycles of adjuvant Xeloda chemotherapy. Symptoms related to hand-foot syndrome are stable since the dose reduction of Xeloda. Plan to proceed with cycle 5 as scheduled on 09/22/2012. He will return for a followup visit in 3 weeks. He will contact the office in the interim with any problems.  Lonna Cobb ANP/GNP-BC

## 2012-09-19 ENCOUNTER — Telehealth: Payer: Self-pay | Admitting: *Deleted

## 2012-09-19 DIAGNOSIS — C187 Malignant neoplasm of sigmoid colon: Secondary | ICD-10-CM

## 2012-09-19 NOTE — Telephone Encounter (Signed)
Will recheck CMET next visit.

## 2012-09-29 ENCOUNTER — Telehealth: Payer: Self-pay | Admitting: *Deleted

## 2012-09-29 NOTE — Telephone Encounter (Signed)
Call from pt's wife: pt has a "chest cold" coughing, ears popping. No fever. No dyspnea. Asking what medication is OK to take while taking Xeloda. Reviewed with Dr. Truett Perna: OK to take OTC cold remedies as needed. She voiced understanding.

## 2012-10-06 ENCOUNTER — Other Ambulatory Visit: Payer: Self-pay | Admitting: *Deleted

## 2012-10-06 NOTE — Telephone Encounter (Signed)
THIS REFILL REQUEST FOR XELODA WAS GIVEN TO DR.SHERRILL'S NURSE, AMY HORTON,RN.

## 2012-10-09 ENCOUNTER — Other Ambulatory Visit (HOSPITAL_BASED_OUTPATIENT_CLINIC_OR_DEPARTMENT_OTHER): Payer: 59 | Admitting: Lab

## 2012-10-09 ENCOUNTER — Ambulatory Visit (HOSPITAL_BASED_OUTPATIENT_CLINIC_OR_DEPARTMENT_OTHER): Payer: 59 | Admitting: Oncology

## 2012-10-09 ENCOUNTER — Other Ambulatory Visit: Payer: Self-pay | Admitting: *Deleted

## 2012-10-09 ENCOUNTER — Telehealth: Payer: Self-pay | Admitting: Oncology

## 2012-10-09 VITALS — BP 143/91 | HR 69 | Temp 98.0°F | Resp 20 | Ht 72.0 in | Wt 221.8 lb

## 2012-10-09 DIAGNOSIS — C19 Malignant neoplasm of rectosigmoid junction: Secondary | ICD-10-CM

## 2012-10-09 DIAGNOSIS — C187 Malignant neoplasm of sigmoid colon: Secondary | ICD-10-CM

## 2012-10-09 DIAGNOSIS — C189 Malignant neoplasm of colon, unspecified: Secondary | ICD-10-CM

## 2012-10-09 LAB — CBC WITH DIFFERENTIAL/PLATELET
Basophils Absolute: 0 10*3/uL (ref 0.0–0.1)
Eosinophils Absolute: 0.1 10*3/uL (ref 0.0–0.5)
HCT: 37 % — ABNORMAL LOW (ref 38.4–49.9)
HGB: 12.3 g/dL — ABNORMAL LOW (ref 13.0–17.1)
LYMPH%: 31.8 % (ref 14.0–49.0)
MCH: 31.8 pg (ref 27.2–33.4)
MCV: 95.4 fL (ref 79.3–98.0)
MONO%: 11 % (ref 0.0–14.0)
NEUT#: 2.6 10*3/uL (ref 1.5–6.5)
NEUT%: 54.4 % (ref 39.0–75.0)
Platelets: 206 10*3/uL (ref 140–400)

## 2012-10-09 LAB — COMPREHENSIVE METABOLIC PANEL (CC13)
Albumin: 3.7 g/dL (ref 3.5–5.0)
Alkaline Phosphatase: 91 U/L (ref 40–150)
BUN: 18.7 mg/dL (ref 7.0–26.0)
Creatinine: 1.5 mg/dL — ABNORMAL HIGH (ref 0.7–1.3)
Glucose: 480 mg/dl — ABNORMAL HIGH (ref 70–99)
Potassium: 4.3 mEq/L (ref 3.5–5.1)
Total Bilirubin: 1.3 mg/dL — ABNORMAL HIGH (ref 0.20–1.20)

## 2012-10-09 MED ORDER — CAPECITABINE 500 MG PO TABS: 1500.0000 mg | ORAL_TABLET | Freq: Two times a day (BID) | ORAL | Status: DC

## 2012-10-09 NOTE — Telephone Encounter (Signed)
Gv the pt Rene Paci contact information so that he can establish primary care

## 2012-10-09 NOTE — Progress Notes (Signed)
Reports skin peeling on hands and feet. Has been pulling skin off his feet. Instructed him not to do this in future for risk of infection. Only lotions feet at night. Instructed him to lotion feet twice daily at a minimum. Asking for Dr. Truett Perna to refill his Metformin, Pravachol and Vasotec since he will not be returning to Dr. Manus Gunning. Plans to locate new PCP

## 2012-10-09 NOTE — Progress Notes (Signed)
   [facility]    OFFICE PROGRESS NOTE   INTERVAL HISTORY:   He returns as scheduled. He reports improvement in the hand/foot skin changes with a dose reduction of Xeloda. No mouth sores, nausea, or diarrhea. He has not been monitoring the blood sugar on a regular schedule.  Objective:  Vital signs in last 24 hours:  Blood pressure 143/91, pulse 69, temperature 98 F (36.7 C), temperature source Oral, resp. rate 20, height 6' (1.829 m), weight 221 lb 12.8 oz (100.608 kg).    HEENT: No thrush or ulcer Resp: Lungs clear bilaterally Cardio: Regular rate and rhythm GI: No hepatomegaly, nontender Vascular: No leg edema  Skin: Hyperpigmentation, dryness, and superficial desquamation at the hands and feet. Mild linear ulcerations at the creases of the finger joints    Lab Results:  Lab Results  Component Value Date   WBC 4.8 10/09/2012   HGB 12.3* 10/09/2012   HCT 37.0* 10/09/2012   MCV 95.4 10/09/2012   PLT 206 10/09/2012   ANC 2.6  Glucose 480    Medications: I have reviewed the patient's current medications.  Assessment/Plan: 1. 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 He began adjuvant Xeloda on 06/30/2012. He began cycle 3 with a dose reduction on 08/11/2012. He completed cycle 5 on 09/22/2012 2. Elevated preoperative CEA. Normal on 06/25/2012. 3. Subcentimeter indeterminate hepatic dome lesion on CT 05/14/2012. 4. Diabetes. 5. Anemia, likely iron deficiency. Improved. 6. Hypercholesterolemia. 7. Hypertension 8. Tiny nodular lung lesions noted on a staging CT 06/23/2012. Likely benign.Restaging CT in 6-9 months will be considered. 9. Hand-foot syndrome secondary to Xeloda. The Xeloda was dose reduced to 1500 mg twice daily beginning with cycle 3 on 08/11/2012. Symptoms are improved.  Disposition:  The plan is to begin cycle 6 of adjuvant  Xeloda on 10/13/2012. The Xeloda will be continued at the current dose.  The blood sugar is markedly elevated today. I recommended he check his blood sugar over the next several days. He will contact us if the blood sugar remains this high. We made a referral to Dr. Bayard Hugger for internal medicine care.  William Barber will return for an office visit on 11/03/2012.   Thornton Papas, MD  10/09/2012  3:22 PM

## 2012-10-10 NOTE — Telephone Encounter (Signed)
RECEIVED A FAX FROM [employer] CONCERNING A CONFIRMATION OF FACSIMILE RECEIPT FOR PT. REFERRAL. 

## 2012-10-14 NOTE — Telephone Encounter (Signed)
RECEIVED A FAX FROM [employer] CONCERNING A CONFIRMATION OF PRESCRIPTION SHIPMENT FOR XELODA ON 10/10/12. 

## 2012-10-28 ENCOUNTER — Encounter (INDEPENDENT_AMBULATORY_CARE_PROVIDER_SITE_OTHER): Payer: Self-pay

## 2012-10-31 ENCOUNTER — Encounter: Payer: Self-pay | Admitting: *Deleted

## 2012-10-31 NOTE — Progress Notes (Signed)
RECEIVED A FAX FROM [facility] CONCERNING A CONFIRMATION OF PRESCRIPTION SHIPMENT FOR XELODA ON 10/30/12.

## 2012-11-03 ENCOUNTER — Other Ambulatory Visit: Payer: 59

## 2012-11-03 ENCOUNTER — Ambulatory Visit: Payer: 59 | Admitting: Oncology

## 2012-11-04 ENCOUNTER — Telehealth: Payer: Self-pay | Admitting: *Deleted

## 2012-11-04 NOTE — Telephone Encounter (Signed)
Left message on voicemail for pt to call office to reschedule missed appt 11/03/12.

## 2012-11-07 ENCOUNTER — Telehealth: Payer: Self-pay | Admitting: Oncology

## 2012-11-07 ENCOUNTER — Other Ambulatory Visit: Payer: Self-pay | Admitting: *Deleted

## 2012-11-07 NOTE — Telephone Encounter (Signed)
S/w the pt and he is aware of his appt on 11/10/2012@9 :15am

## 2012-11-10 ENCOUNTER — Ambulatory Visit (HOSPITAL_BASED_OUTPATIENT_CLINIC_OR_DEPARTMENT_OTHER): Payer: 59 | Admitting: Oncology

## 2012-11-10 ENCOUNTER — Telehealth: Payer: Self-pay | Admitting: Oncology

## 2012-11-10 ENCOUNTER — Telehealth: Payer: Self-pay | Admitting: *Deleted

## 2012-11-10 ENCOUNTER — Ambulatory Visit (HOSPITAL_BASED_OUTPATIENT_CLINIC_OR_DEPARTMENT_OTHER): Payer: 59 | Admitting: Lab

## 2012-11-10 VITALS — BP 136/75 | HR 65 | Temp 97.9°F | Resp 18 | Ht 72.0 in | Wt 212.7 lb

## 2012-11-10 DIAGNOSIS — C187 Malignant neoplasm of sigmoid colon: Secondary | ICD-10-CM

## 2012-11-10 DIAGNOSIS — C19 Malignant neoplasm of rectosigmoid junction: Secondary | ICD-10-CM

## 2012-11-10 DIAGNOSIS — E119 Type 2 diabetes mellitus without complications: Secondary | ICD-10-CM

## 2012-11-10 DIAGNOSIS — L270 Generalized skin eruption due to drugs and medicaments taken internally: Secondary | ICD-10-CM

## 2012-11-10 LAB — COMPREHENSIVE METABOLIC PANEL (CC13)
AST: 22 U/L (ref 5–34)
Albumin: 4.1 g/dL (ref 3.5–5.0)
Alkaline Phosphatase: 78 U/L (ref 40–150)
Potassium: 4.7 mEq/L (ref 3.5–5.1)
Sodium: 129 mEq/L — ABNORMAL LOW (ref 136–145)
Total Protein: 8.2 g/dL (ref 6.4–8.3)

## 2012-11-10 LAB — CBC WITH DIFFERENTIAL/PLATELET
BASO%: 0.8 % (ref 0.0–2.0)
EOS%: 1.3 % (ref 0.0–7.0)
MCH: 31.8 pg (ref 27.2–33.4)
MCHC: 35.3 g/dL (ref 32.0–36.0)
RBC: 4.4 10*6/uL (ref 4.20–5.82)
RDW: 18.1 % — ABNORMAL HIGH (ref 11.0–14.6)
lymph#: 1.4 10*3/uL (ref 0.9–3.3)

## 2012-11-10 NOTE — Progress Notes (Signed)
    [facility]    OFFICE PROGRESS NOTE   INTERVAL HISTORY:   He missed a scheduled appointment last week. He began another cycle of Xeloda on 11/03/2012. The skin thickening at the hands and feet is stable. No mouth sores. The mouth is dry. He reports the blood sugar has been elevated. He continues to work. No diarrhea. Good appetite, but he has noted weight loss.  Objective:  Vital signs in last 24 hours:  Blood pressure 136/75, pulse 65, temperature 97.9 F (36.6 C), temperature source Oral, resp. rate 18, height 6' (1.829 m), weight 212 lb 11.2 oz (96.48 kg).    HEENT: No thrush or ulcers Resp: Lungs clear bilaterally Cardio: Regular rate and rhythm GI: No hepatomegaly, nontender Vascular: No leg edema  Skin: Hyperpigmentation at the hands and feet with superficial desquamation, dryness, and a few areas of superficial linear ulceration     Lab Results:  Lab Results  Component Value Date   WBC 3.9* 11/10/2012   HGB 14.0 11/10/2012   HCT 39.7 11/10/2012   MCV 90.2 11/10/2012   PLT 233 11/10/2012   ANC 2.0  Study 139, potassium 4.7, BUN 18, creatinine 1.6, glucose 510    Medications: I have reviewed the patient's current medications.  Assessment/Plan: 1. 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 He began adjuvant Xeloda on 06/30/2012. He began cycle 3 with a dose reduction on 08/11/2012. He completed began cycle 7 on 11/03/2012  2. Elevated preoperative CEA. Normal on 06/25/2012. 3. Subcentimeter indeterminate hepatic dome lesion on CT 05/14/2012. 4. Diabetes. 5. Anemia, likely iron deficiency. Improved. 6. Hypercholesterolemia. 7. Hypertension 8. Tiny nodular lung lesions noted on a staging CT 06/23/2012. Likely benign.Restaging CT in 6-9 months will be considered. 9. Hand-foot syndrome secondary to Xeloda. The Xeloda was dose reduced to 1500 mg  twice daily beginning with cycle 3 on 08/11/2012. Symptoms are improved. 10. Elevated BUN/creatinine-? Dehydration related to hyperglycemia 11. Weight loss-most likely related to diabetes  Disposition:  The blood sugar remains markedly elevated and he is losing weight. We will contact [facility] to see if he can be seen 11/11/2012. We received the chemistry panel after he left the office today. I will ask him to monitor the blood sugar closely and avoid concentrated sweets. I will place the Xeloda on hold until the blood sugar is under better control.   Thornton Papas, MD  11/10/2012  5:56 PM

## 2012-11-10 NOTE — Telephone Encounter (Signed)
Wife called concerned that 10 lb weight loss was noted at today's appointment and patient told her that his kidney functions were "off". She expresses concern and wishes to know what to do?  Cmet shows his diabetes is not under control. MD made aware.

## 2012-11-11 ENCOUNTER — Ambulatory Visit (INDEPENDENT_AMBULATORY_CARE_PROVIDER_SITE_OTHER): Payer: 59 | Admitting: Internal Medicine

## 2012-11-11 ENCOUNTER — Encounter: Payer: Self-pay | Admitting: Internal Medicine

## 2012-11-11 ENCOUNTER — Encounter: Payer: Self-pay | Admitting: *Deleted

## 2012-11-11 VITALS — BP 140/88 | HR 65 | Temp 97.5°F | Ht 72.0 in | Wt 216.0 lb

## 2012-11-11 DIAGNOSIS — E119 Type 2 diabetes mellitus without complications: Secondary | ICD-10-CM

## 2012-11-11 DIAGNOSIS — R7309 Other abnormal glucose: Secondary | ICD-10-CM

## 2012-11-11 DIAGNOSIS — R739 Hyperglycemia, unspecified: Secondary | ICD-10-CM

## 2012-11-11 MED ORDER — INSULIN LISPRO 100 UNIT/ML KWIKPEN: 7.0000 [IU] | Freq: Three times a day (TID) | SUBCUTANEOUS | Status: DC

## 2012-11-11 NOTE — Progress Notes (Signed)
Late entry for 11/10/12: Dr. Truett Perna called patient after hours on 11/10/12 and instructed him to hold his xeloda and to avoid concentrated sweets and push po fluids. Recheck CBG and call if > 500. He will arrange for  IM to see him on 11/11/12. May need insulin.
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
## 2012-11-11 NOTE — Patient Instructions (Addendum)
Hyperglycemia Hyperglycemia occurs when the glucose (sugar) in your blood is too high. Hyperglycemia can happen for many reasons, but it most often happens to people who do not know they have diabetes or are not managing their diabetes properly.  CAUSES  Whether you have diabetes or not, there are other causes of hyperglycemia. Hyperglycemia can occur when you have diabetes, but it can also occur in other situations that you might not be as aware of, such as: Diabetes  If you have diabetes and are having problems controlling your blood glucose, hyperglycemia could occur because of some of the following reasons:  Not following your meal plan.  Not taking your diabetes medications or not taking it properly.  Exercising less or doing less activity than you normally do.  Being sick. Pre-diabetes  This cannot be ignored. Before people develop Type 2 diabetes, they almost always have "pre-diabetes." This is when your blood glucose levels are higher than normal, but not yet high enough to be diagnosed as diabetes. Research has shown that some long-term damage to the body, especially the heart and circulatory system, may already be occurring during pre-diabetes. If you take action to manage your blood glucose when you have pre-diabetes, you may delay or prevent Type 2 diabetes from developing. Stress  If you have diabetes, you may be "diet" controlled or on oral medications or insulin to control your diabetes. However, you may find that your blood glucose is higher than usual in the hospital whether you have diabetes or not. This is often referred to as "stress hyperglycemia." Stress can elevate your blood glucose. This happens because of hormones put out by the body during times of stress. If stress has been the cause of your high blood glucose, it can be followed regularly by your caregiver. That way he/she can make sure your hyperglycemia does not continue to get worse or progress to  diabetes. Steroids  Steroids are medications that act on the infection fighting system (immune system) to block inflammation or infection. One side effect can be a rise in blood glucose. Most people can produce enough extra insulin to allow for this rise, but for those who cannot, steroids make blood glucose levels go even higher. It is not unusual for steroid treatments to "uncover" diabetes that is developing. It is not always possible to determine if the hyperglycemia will go away after the steroids are stopped. A special blood test called an A1c is sometimes done to determine if your blood glucose was elevated before the steroids were started. SYMPTOMS  Thirsty.  Frequent urination.  Dry mouth.  Blurred vision.  Tired or fatigue.  Weakness.  Sleepy.  Tingling in feet or leg. DIAGNOSIS  Diagnosis is made by monitoring blood glucose in one or all of the following ways:  A1c test. This is a chemical found in your blood.  Fingerstick blood glucose monitoring.  Laboratory results. TREATMENT  First, knowing the cause of the hyperglycemia is important before the hyperglycemia can be treated. Treatment may include, but is not be limited to:  Education.  Change or adjustment in medications.  Change or adjustment in meal plan.  Treatment for an illness, infection, etc.  More frequent blood glucose monitoring.  Change in exercise plan.  Decreasing or stopping steroids.  Lifestyle changes. HOME CARE INSTRUCTIONS   Test your blood glucose as directed.  Exercise regularly. Your caregiver will give you instructions about exercise. Pre-diabetes or diabetes which comes on with stress is helped by exercising.  Eat wholesome,   balanced meals. Eat often and at regular, fixed times. Your caregiver or nutritionist will give you a meal plan to guide your sugar intake.  Being at an ideal weight is important. If needed, losing as little as 10 to 15 pounds may help improve blood  glucose levels. SEEK MEDICAL CARE IF:   You have questions about medicine, activity, or diet.  You continue to have symptoms (problems such as increased thirst, urination, or weight gain). SEEK IMMEDIATE MEDICAL CARE IF:   You are vomiting or have diarrhea.  Your breath smells fruity.  You are breathing faster or slower.  You are very sleepy or incoherent.  You have numbness, tingling, or pain in your feet or hands.  You have chest pain.  Your symptoms get worse even though you have been following your caregiver's orders.  If you have any other questions or concerns. Document Released: 02/20/2001 Document Revised: 11/19/2011 Document Reviewed: 12/24/2011 [facility] Patient Information 2013 [place], [place]. Insulin Lispro injection What is this medicine? INSULIN LISPRO (IN su lin LYE sproe) is a human-made form of insulin. This drug lowers the amount of sugar in your blood. This medicine is a rapid-acting insulin that starts working faster than regular insulin. It will not work as long as regular insulin. This medicine may be used for other purposes; ask your health care provider or pharmacist if you have questions. What should I tell my health care provider before I take this medicine? They need to know if you have any of these conditions: -episodes of hypoglycemia -kidney disease -liver disease -an unusual or allergic reaction to insulin, metacresol, other medicines, foods, dyes, or preservatives -pregnant or trying to get pregnant -breast-feeding How should I use this medicine? This medicine is for injection under the skin. Use exactly as directed. It is important to follow the directions given to you by your health care professional or doctor. You should inject this medicine within 15 minutes before or after your meal. You will be taught how to use this medicine and how to adjust doses for activities and illness. Do not use more insulin than prescribed. Do not use more or less  often than prescribed. Always check the appearance of your insulin before using it. This medicine should be clear and colorless like water. Do not use it if it is cloudy, thickened, colored, or has solid particles in it. It is important that you put your used needles and syringes in a special sharps container. Do not put them in a trash can. If you do not have a sharps container, call your pharmacist or healthcare provider to get one. Talk to your pediatrician regarding the use of this medicine in children. Special care may be needed. Overdosage: If you think you have taken too much of this medicine contact a poison control center or emergency room at once. NOTE: This medicine is only for you. Do not share this medicine with others. What if I miss a dose? It is important not to miss a dose. Your health care professional or doctor should discuss a plan for missed doses with you. If you do miss a dose, follow their plan. Do not take double doses. What may interact with this medicine? -other medicines for diabetes Many medications may cause an increase or decrease in blood sugar, these include: -alcohol containing beverages -aspirin and aspirin-like drugs -chloramphenicol -chromium -diuretics -male hormones, like estrogens or progestins and birth control pills -heart medicines -isoniazid -male hormones or anabolic steroids -medicines for weight loss -medicines for  allergies, asthma, cold, or cough -medicines for mental problems -medicines called MAO Inhibitors like Nardil, Parnate, Marplan, Eldepryl -niacin -NSAIDs, medicines for pain and inflammation, like ibuprofen or naproxen -pentamidine -phenytoin -probenecid -quinolone antibiotics like ciprofloxacin, levofloxacin, ofloxacin -some herbal dietary supplements -steroid medicines like prednisone or cortisone -thyroid medicine Some medications can hide the warning symptoms of low blood sugar. You may need to monitor your blood sugar  more closely if you are taking one of these medications. These include: -beta-blockers such as atenolol, metoprolol, propranolol -clonidine -guanethidine -reserpine This list may not describe all possible interactions. Give your health care provider a list of all the medicines, herbs, non-prescription drugs, or dietary supplements you use. Also tell them if you smoke, drink alcohol, or use illegal drugs. Some items may interact with your medicine. What should I watch for while using this medicine? Visit your health care professional or doctor for regular checks on your progress. To control your diabetes you must use this medicine regularly and follow a diet and exercise schedule. Checking and recording your blood sugar and urine ketone levels regularly is important. Use a blood sugar measuring device before you treat high or low blood sugar. Always carry a quick-source of sugar with you in case you have symptoms of low blood sugar. Examples include hard sugar candy or glucose tablets. Make sure family members know that you can choke if you eat or drink when you develop serious symptoms of low blood sugar, such as seizures or unconsciousness. They must get medical help at once. Make sure that you have the right kind of syringe for the type of insulin you use. Try not to change the brand and type of insulin or syringe unless your health care professional or doctor tells you to. Switching insulin brand or type can cause dangerously high or low blood sugar. Always keep an extra supply of insulin, syringes, and needles on hand. Use a syringe one time only. Throw away syringe and needle in a closed container to prevent accidental needle sticks. Insulin pens and cartridges should never be shared. Sharing may result in passing of viruses like hepatitis or HIV. Wear a medical identification bracelet or chain to say you have diabetes, and carry a card that lists all your medications. Many nonprescription cough and  cold products contain sugar or alcohol. These can affect diabetes control or can alter the results of tests used to monitor blood sugar. Avoid alcohol. Avoid products that contain alcohol or sugar. What side effects may I notice from receiving this medicine? Side effects that you should report to your health care professional or doctor as soon as possible: Symptoms of low blood sugar: -You may feel nervous, confused, dizzy, hungry, weak, sweaty, shaky, cold, and irritable. You may also experience headache, blurred vision, rapid heartbeat and loss of consciousness. Symptoms of high blood sugar: -You may experience dizziness, dry mouth, dry skin, fruity breath, loss of appetite, nausea, stomach ache, unusual thirst, frequent urination Insulin also can cause rare but serious allergic reactions in some patients, including: -bad skin rash and itching -breathing problems Side effects that usually do not require medical attention (report to your health care professional or doctor if they continue or are bothersome): -increase or decrease in fatty tissue under the skin, through overuse of a particular injection -itching, burning, swelling, or rash at the injection site This list may not describe all possible side effects. Call your doctor for medical advice about side effects. You may report side effects to FDA at  1-800-FDA-1088. Where should I keep my medicine? Keep out of the reach of children. Store unopened insulin vials in a refrigerator between 2 and 8 degrees C (36 and 46 degrees F). Do not freeze or use if the insulin has been frozen. Opened vials (vials currently in use) may be stored in the refrigerator or at room temperature, at approximately 30 degrees C (86 degrees F) or cooler. Keeping your insulin at room temperature decreases the amount of pain during injection. Once opened, your insulin can be used for 28 days. After 28 days, the vial of insulin should be thrown away. Store unopened  cartridges or disposable pens in a refrigerator between 2 and 8 degrees C (36 and 46 degrees F.) Do not freeze or use if the insulin has been frozen. Once opened, the disposable pens and cartridges that are inserted into pens should be kept at room temperature, approximately 30 degrees C (80 degrees F) or cooler. Do not store in the refrigerator. Once opened, the insulin can be used for 28 days. After 28 days, the cartridge or disposable pen should be thrown away. Protect from light and excessive heat. Throw away any unused medicine after the expiration date or after the specified time for room temperature storage has passed. NOTE: This sheet is a summary. It may not cover all possible information. If you have questions about this medicine, talk to your doctor, pharmacist, or health care provider.  2012, [employer]vier/[employer]. (11/28/2007 11:53:25 AM)

## 2012-11-11 NOTE — Assessment & Plan Note (Signed)
Continue Metformin 500 mg BID today Will start insulin lispro 7 units before each meal Continue to monitor blood sugars Call me if blood sugars are still above 200

## 2012-11-13 ENCOUNTER — Other Ambulatory Visit: Payer: Self-pay | Admitting: *Deleted

## 2012-11-13 MED ORDER — INSULIN PEN NEEDLE 32G X 4 MM MISC: Status: DC

## 2012-11-17 ENCOUNTER — Other Ambulatory Visit: Payer: Self-pay | Admitting: *Deleted

## 2012-11-17 NOTE — Telephone Encounter (Signed)
THIS REFILL REQUEST FOR XELODA WAS GIVEN TO DR.SHERRILL'S NURSE, TANYA WHITLOCK,RN. 

## 2012-11-18 ENCOUNTER — Other Ambulatory Visit (INDEPENDENT_AMBULATORY_CARE_PROVIDER_SITE_OTHER): Payer: 59

## 2012-11-18 ENCOUNTER — Ambulatory Visit (INDEPENDENT_AMBULATORY_CARE_PROVIDER_SITE_OTHER): Payer: 59 | Admitting: Internal Medicine

## 2012-11-18 ENCOUNTER — Encounter: Payer: Self-pay | Admitting: Internal Medicine

## 2012-11-18 VITALS — BP 140/88 | HR 67 | Temp 98.4°F | Wt 219.0 lb

## 2012-11-18 DIAGNOSIS — I10 Essential (primary) hypertension: Secondary | ICD-10-CM

## 2012-11-18 DIAGNOSIS — IMO0001 Reserved for inherently not codable concepts without codable children: Secondary | ICD-10-CM

## 2012-11-18 DIAGNOSIS — E7800 Pure hypercholesterolemia, unspecified: Secondary | ICD-10-CM

## 2012-11-18 DIAGNOSIS — C187 Malignant neoplasm of sigmoid colon: Secondary | ICD-10-CM

## 2012-11-18 LAB — BASIC METABOLIC PANEL
CO2: 24 mEq/L (ref 19–32)
Chloride: 102 mEq/L (ref 96–112)
Potassium: 4.5 mEq/L (ref 3.5–5.1)

## 2012-11-18 MED ORDER — INSULIN DETEMIR 100 UNIT/ML ~~LOC~~ SOLN: 15.0000 [IU] | Freq: Every day | SUBCUTANEOUS | Status: DC

## 2012-11-18 MED ORDER — LOVASTATIN 40 MG PO TABS: 40.0000 mg | ORAL_TABLET | Freq: Every day | ORAL | Status: DC

## 2012-11-18 NOTE — Patient Instructions (Addendum)
It was good to see you today. We have reviewed your prior records including labs and tests today Test(s) ordered today. Your results will be released to MyChart (or called to you) after review, usually within 72hours after test completion. If any changes need to be made, you will be notified at that same time. Start Levemir 15Units at bedtime - call if AM fasting sugar <150 to stop this medication  Continue meal coverage insulin and metformin 2x/day until further notice Call if cbg >250 or <70 at any time for other adjustments as needed Change pravastatin to lovastatin for cholesterol as discussed Your prescription(s) have been submitted to your pharmacy. Please take as directed and contact our office if you believe you are having problem(s) with the medication(s). we will send to your prior provider(s) for "release of records" as discussed today -  Please schedule followup in  2 weeks with me, call sooner if problems. Canceled the April 4 appointment with Dr. Yetta Barre as discussed

## 2012-11-18 NOTE — Assessment & Plan Note (Signed)
Reports he has been off statin for >6 weeks due to formulary now lovastatin, prev on prava New rx for statin now Send for ROI and monitor ongoing

## 2012-11-18 NOTE — Assessment & Plan Note (Signed)
BP Readings from Last 3 Encounters:  11/18/12 140/88  11/11/12 140/88  11/10/12 136/75   The current medical regimen is effective;  continue present plan and medications.

## 2012-11-18 NOTE — Assessment & Plan Note (Signed)
Interval hx reviewed - Holding Xeloda due to extreme hyperglycemia - see above - and reports due to complete same anyway in upcoming days Follows with onc regularly;  continue present plan and medications.

## 2012-11-18 NOTE — Assessment & Plan Note (Signed)
Reviewed hx - will be transferring care here from [facility] - will send ROI now Add basal insulin 15U qhs until AM cbg<150 Check a1c and Cr now - consider stopping metformin Cr Lab Results  Component Value Date   HGBA1C 6.1* 06/06/2012

## 2012-11-18 NOTE — Progress Notes (Signed)
Subjective:    Patient ID: William Barber, male    DOB: 1961-08-08, 52 y.o.   MRN: [id number]  HPI  New pt to me - seen last week by NP for 1st time as acute work in for extreme hyperglycemia - prior OV reviewed Here today for follow up and to establish with new PCP Shaelin Lalley - prev at [place]  Reviewed chronic medical issues: DM2 - reports generally under good control with a1c <6.5 -100% compliance with metformin as rx'd - dose increased + meal coverage insulin as rx'd last week - ?cbgs up due to chemo? Holding xeloda due to same - cbgs still 200-300+ in past week, fears hypoglycemia - no nausea and vomiting or abdominal pain. Improved pu/pd and blurred vision since last week  Dyslipidemia - rx'd statin but not on same >6 weeks due to formulary change and no new rx - prev, the patient reports compliance with medication(s) as prescribed. Denies adverse side effects.   hypertension - on ACEI - the patient reports compliance with medication(s) as prescribed. Denies adverse side effects.  Colon ca, sigmoid - dx 05/2012. Underwent LAR and chemo - following closely with onc for same  Past Medical History  Diagnosis Date  . Hypertension   . IBS (irritable bowel syndrome)   . Hypercholesterolemia   . Liver mass on CT Sep2013 05/19/2012  . 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 IBS (irritable bowel syndrome)   . Type 2 diabetes mellitus, uncontrolled     dx age 48   Family History  Problem Relation Age of Onset  . Cancer Sister     cervical cancer  . Cancer Paternal Grandfather     colon cancer   History  Substance Use Topics  . Smoking status: Former Smoker    Types: Cigarettes    Quit date: 06/25/1985  . Smokeless tobacco: Never Used     Comment: 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 [employer]  . Alcohol Use: Yes     Comment: beer occasionally    Review of Systems Constitutional: Negative for fever or  ongoing weight change -10# since dx with cancer 05/2012, total >50# lost since last year, unintended.  Respiratory: Negative for cough and shortness of breath.   Cardiovascular: Negative for chest pain or palpitations.  Gastrointestinal: Negative for abdominal pain, no bowel changes.  Musculoskeletal: Negative for gait problem or joint swelling.  Skin: Negative for rash.  Neurological: Negative for dizziness or headache.  No other specific complaints in a complete review of systems (except as listed in HPI above).     Objective:   Physical Exam BP 140/88  Pulse 67  Temp(Src) 98.4 F (36.9 C) (Oral)  Wt 219 lb (99.338 kg)  BMI 29.7 kg/m2  SpO2 97% Wt Readings from Last 3 Encounters:  11/18/12 219 lb (99.338 kg)  11/11/12 216 lb (97.977 kg)  11/10/12 212 lb 11.2 oz (96.48 kg)   Constitutional:  He appears well-developed and well-nourished. No distress. Eyes: PERRL, EOMI - vision grossly intact B  Neck: Normal range of motion. Neck supple. No JVD or LAD present. No thyromegaly present.  Cardiovascular: Normal rate, regular rhythm and normal heart sounds.  No murmur heard. no BLE edema Pulmonary/Chest: Effort normal and breath sounds normal. No respiratory distress. no wheezes.  Abdominal: Soft. Bowel sounds are normal. Patient exhibits no distension. There is no tenderness.  Musculoskeletal: Normal range of motion. Patient exhibits no gross deformity.  Neurological:  he is alert and oriented to person, place, and time. No cranial nerve deficit. Coordination normal.  Skin: Skin is warm and dry.  No erythema or ulceration.  Psychiatric: he has a normal mood and affect. behavior is normal. Judgment and thought content normal.   Lab Results  Component Value Date   WBC 3.9* 11/10/2012   HGB 14.0 11/10/2012   HCT 39.7 11/10/2012   PLT 233 11/10/2012   GLUCOSE 510* 11/10/2012   ALT 31 11/10/2012   AST 22 11/10/2012   NA 129* 11/10/2012   K 4.7 11/10/2012   CL 95* 11/10/2012   CREATININE 1.6* 11/10/2012    BUN 18.0 11/10/2012   CO2 25 11/10/2012   HGBA1C 6.1* 06/06/2012        Assessment & Plan:  See problem list. Medications and labs reviewed today.  Time spent with pt today 40 minutes, greater than 50% time spent counseling patient on diabetes, lipids and colon cancer + medication review. Also review of prior records and need for ROI from prior PCP

## 2012-11-20 ENCOUNTER — Other Ambulatory Visit (INDEPENDENT_AMBULATORY_CARE_PROVIDER_SITE_OTHER): Payer: 59

## 2012-11-20 ENCOUNTER — Telehealth: Payer: Self-pay | Admitting: Internal Medicine

## 2012-11-20 DIAGNOSIS — R739 Hyperglycemia, unspecified: Secondary | ICD-10-CM

## 2012-11-20 DIAGNOSIS — R7309 Other abnormal glucose: Secondary | ICD-10-CM

## 2012-11-20 LAB — HEMOGLOBIN A1C: Hgb A1c MFr Bld: 12.3 % — ABNORMAL HIGH (ref 4.6–6.5)

## 2012-11-20 MED ORDER — GLUCOSE BLOOD VI STRP: ORAL_STRIP | Status: DC

## 2012-11-20 NOTE — Telephone Encounter (Signed)
Rx sent to [facility] for test strips.

## 2012-11-20 NOTE — Telephone Encounter (Signed)
Called to request One Tough Ultra test strips be sent to [facility] on [address] in [facility] [place] [facility] [phone].  Last office visit 11/18/12. Tests blood sugar 6 X/daily.  Is out of test strips.  Requests this Rx be sent ASAP to new pharmacy closer to home.

## 2012-11-21 ENCOUNTER — Telehealth: Payer: Self-pay | Admitting: Oncology

## 2012-11-21 ENCOUNTER — Other Ambulatory Visit (HOSPITAL_BASED_OUTPATIENT_CLINIC_OR_DEPARTMENT_OTHER): Payer: 59 | Admitting: Lab

## 2012-11-21 ENCOUNTER — Ambulatory Visit (HOSPITAL_BASED_OUTPATIENT_CLINIC_OR_DEPARTMENT_OTHER): Payer: 59 | Admitting: Nurse Practitioner

## 2012-11-21 VITALS — BP 136/93 | HR 68 | Temp 97.1°F | Resp 20 | Ht 72.0 in | Wt 220.3 lb

## 2012-11-21 DIAGNOSIS — C187 Malignant neoplasm of sigmoid colon: Secondary | ICD-10-CM

## 2012-11-21 DIAGNOSIS — C19 Malignant neoplasm of rectosigmoid junction: Secondary | ICD-10-CM

## 2012-11-21 DIAGNOSIS — IMO0001 Reserved for inherently not codable concepts without codable children: Secondary | ICD-10-CM

## 2012-11-21 DIAGNOSIS — E119 Type 2 diabetes mellitus without complications: Secondary | ICD-10-CM

## 2012-11-21 DIAGNOSIS — I10 Essential (primary) hypertension: Secondary | ICD-10-CM

## 2012-11-21 LAB — CBC WITH DIFFERENTIAL/PLATELET
Basophils Absolute: 0 10*3/uL (ref 0.0–0.1)
Eosinophils Absolute: 0.1 10*3/uL (ref 0.0–0.5)
HCT: 39.3 % (ref 38.4–49.9)
HGB: 13.4 g/dL (ref 13.0–17.1)
MONO#: 0.5 10*3/uL (ref 0.1–0.9)
NEUT%: 45.6 % (ref 39.0–75.0)
Platelets: 210 10*3/uL (ref 140–400)
WBC: 3.8 10*3/uL — ABNORMAL LOW (ref 4.0–10.3)
lymph#: 1.5 10*3/uL (ref 0.9–3.3)

## 2012-11-21 NOTE — Progress Notes (Signed)
OFFICE PROGRESS NOTE  Interval history:  Mr. Dault returns as scheduled. He began cycle 7 adjuvant Xeloda on 11/03/2012. Cycle 7 was placed on hold after approximately 1 week due to poorly controlled blood sugars. He is now on insulin. The hemoglobin A1c returned markedly elevated at 12.3% on 11/20/2012 as compared to 6.1% September 2013. He reports blood sugars are now consistently less than 300.  He denies nausea/vomiting. No mouth sores. No diarrhea. Stable skin thickening and dryness at the hands and feet. He notes decreased thirst and urination.   Objective: Blood pressure 136/93, pulse 68, temperature 97.1 F (36.2 C), temperature source Oral, resp. rate 20, height 6' (1.829 m), weight 220 lb 4.8 oz (99.927 kg).  Oropharynx is without thrush or ulceration. Lungs are clear. Regular cardiac rhythm. Abdomen is soft and nontender. No hepatomegaly. Extremities are without edema. Hyperpigmentation at the hands and feet with dryness, areas of superficial linear ulceration.  Lab Results: Lab Results  Component Value Date   WBC 3.8* 11/21/2012   HGB 13.4 11/21/2012   HCT 39.3 11/21/2012   MCV 96.7 11/21/2012   PLT 210 11/21/2012    Chemistry:    Chemistry      Component Value Date/Time   NA 133* 11/18/2012 1155   NA 129* 11/10/2012 1021   K 4.5 11/18/2012 1155   K 4.7 11/10/2012 1021   CL 102 11/18/2012 1155   CL 95* 11/10/2012 1021   CO2 24 11/18/2012 1155   CO2 25 11/10/2012 1021   BUN 19 11/18/2012 1155   BUN 18.0 11/10/2012 1021   CREATININE 1.2 11/18/2012 1155   CREATININE 1.6* 11/10/2012 1021      Component Value Date/Time   CALCIUM 9.4 11/18/2012 1155   CALCIUM 9.7 11/10/2012 1021   ALKPHOS 78 11/10/2012 1021   AST 22 11/10/2012 1021   ALT 31 11/10/2012 1021   BILITOT 1.27* 11/10/2012 1021       Studies/Results: No results found.  Medications: I have reviewed the patient's current medications.  Assessment/Plan:  1. 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 He began adjuvant Xeloda on 06/30/2012. He began cycle 3 with a dose reduction on 08/11/2012. He began cycle 7 11/03/2012. Cycle 7 was prematurely discontinued after approximately 1 week due to poorly controlled blood sugars. 2. Elevated preoperative CEA. Normal on 06/25/2012. 3. Subcentimeter indeterminate hepatic dome lesion on CT 05/14/2012. 4. Diabetes. He is now on insulin. 5. Anemia, likely iron deficiency. Improved. 6. Hypercholesterolemia. 7. Hypertension 8. Tiny nodular lung lesions noted on a staging CT 06/23/2012. Likely benign.Restaging CT in 6-9 months will be considered. 9. Hand-foot syndrome secondary to Xeloda. The Xeloda was dose reduced to 1500 mg twice daily beginning with cycle 3 on 08/11/2012. Symptoms are improved. 10. Elevated BUN/creatinine 11/10/2012-? dehydration related to hyperglycemia. Improved on 11/18/2012. 11. Weight loss-most likely related to diabetes. Improved.  Disposition-William Barber appears stable. Blood sugars are under better control. He will begin the eighth and final cycle of adjuvant Xeloda on 11/24/2012. He will return for a followup visit on 12/19/2012. He will contact the office in the interim with any problems.  We made a referral to the diabetes nutrition center.  Plan reviewed with Dr. Truett Perna.    Lonna Cobb ANP/GNP-BC

## 2012-11-21 NOTE — Telephone Encounter (Signed)
gv and printed appt schedule for pt for april...Marland KitchenMarland KitchenCalled and lvm for [facility] info and my call back #

## 2012-11-24 ENCOUNTER — Encounter: Payer: Self-pay | Admitting: Internal Medicine

## 2012-11-24 NOTE — Telephone Encounter (Signed)
RECEIVED A FAX FROM [employer] CONCERNING A CONFIRMATION OF FACSIMILE RECEIPT FOR PT. REFERRAL. 

## 2012-11-25 NOTE — Telephone Encounter (Addendum)
RECEIVED A FAX FROM [facility] CONCERNING A CONFIRMATION OF PRESCRIPTION SHIPMENT FOR XELODA ON 11/24/12.

## 2012-12-02 ENCOUNTER — Telehealth: Payer: Self-pay | Admitting: Dietician

## 2012-12-02 ENCOUNTER — Encounter: Payer: Self-pay | Admitting: Internal Medicine

## 2012-12-02 ENCOUNTER — Ambulatory Visit (INDEPENDENT_AMBULATORY_CARE_PROVIDER_SITE_OTHER): Payer: 59 | Admitting: Internal Medicine

## 2012-12-02 VITALS — BP 120/72 | HR 63 | Temp 98.3°F | Wt 216.1 lb

## 2012-12-02 DIAGNOSIS — IMO0001 Reserved for inherently not codable concepts without codable children: Secondary | ICD-10-CM

## 2012-12-02 DIAGNOSIS — I10 Essential (primary) hypertension: Secondary | ICD-10-CM

## 2012-12-02 MED ORDER — INSULIN DETEMIR 100 UNIT/ML ~~LOC~~ SOLN: 25.0000 [IU] | Freq: Every day | SUBCUTANEOUS | Status: DC

## 2012-12-02 MED ORDER — INSULIN LISPRO 100 UNIT/ML KWIKPEN: 5.0000 [IU] | Freq: Three times a day (TID) | SUBCUTANEOUS | Status: DC

## 2012-12-02 NOTE — Patient Instructions (Addendum)
It was good to see you today. We have reviewed your prior records including labs and tests today Increase to Levemir 25Units at bedtime - call if AM fasting sugar <150 to reduce this medication  Continue 5-10 units Humalog meal coverage 3x/day and metformin 2x/day until further notice Call/email via MyChart if cbg >200 or <70 at any time for other adjustments as needed Please schedule followup in 6 weeks with me, call sooner if problems. Bring your sugar logbook to this appointment for review

## 2012-12-02 NOTE — Assessment & Plan Note (Signed)
Reviewed hx and home cbgs (range 188-300+); transfered care here from [facility] 11/2012 Added basal insulin  11/08/12, titrate up until AM cbg<150 Continue metformin and Humalog TID Check a1c q1mo Refer to nutrition done - appt pending 12/19/12   Lab Results  Component Value Date   HGBA1C 12.3* 11/20/2012

## 2012-12-02 NOTE — Progress Notes (Signed)
  Subjective:    Patient ID: William Barber, male    DOB: 1961/04/06, 52 y.o.   MRN: [id number]  HPI  Here today for follow up - reviewed chronic medical issues: DM2 - reports generally under good control in past with a1c <6.5; 100% compliance with metformin and Levemir + Humalog TID as rx'd - ?cbgs up due to chemo? Resumed last cycle xeloda since last OV- cbgs still 200-300+; fears hypoglycemia - no nausea and vomiting or abdominal pain. Improved pu/pd and blurred vision   Dyslipidemia - change statin 11/2012 due to formulary change - the patient reports compliance with medication(s) as prescribed. Denies adverse side effects.   hypertension - on ACEI - the patient reports compliance with medication(s) as prescribed. Denies adverse side effects.  Colon ca, sigmoid - dx 05/2012. Underwent LAR and chemo - following closely with onc for same  Past Medical History  Diagnosis Date  . Hypertension   . IBS (irritable bowel syndrome)   . Hypercholesterolemia   . Liver mass on CT Sep2013 05/19/2012  . 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 IBS (irritable bowel syndrome)   . Type 2 diabetes mellitus, uncontrolled     dx age 69    Review of Systems  Constitutional: Negative for fever or ongoing weight change -10# since dx with cancer 05/2012, total >50# lost since last year, unintended.  Respiratory: Negative for cough and shortness of breath.   Cardiovascular: Negative for chest pain or palpitations.       Objective:   Physical Exam  BP 120/72  Pulse 63  Temp(Src) 98.3 F (36.8 C) (Oral)  Wt 216 lb 1.9 oz (98.031 kg)  BMI 29.3 kg/m2  SpO2 98% Wt Readings from Last 3 Encounters:  12/02/12 216 lb 1.9 oz (98.031 kg)  11/21/12 220 lb 4.8 oz (99.927 kg)  11/18/12 219 lb (99.338 kg)   Constitutional:  He appears well-developed and well-nourished. No distress. Neck: Normal range of  motion. Neck supple. No JVD or LAD present. No thyromegaly present.  Cardiovascular: Normal rate, regular rhythm and normal heart sounds.  No murmur heard. no BLE edema Pulmonary/Chest: Effort normal and breath sounds normal. No respiratory distress. no wheezes.  Psychiatric: he has a normal mood and affect. behavior is normal. Judgment and thought content normal.   Lab Results  Component Value Date   WBC 3.8* 11/21/2012   HGB 13.4 11/21/2012   HCT 39.3 11/21/2012   PLT 210 11/21/2012   GLUCOSE 299* 11/18/2012   ALT 31 11/10/2012   AST 22 11/10/2012   NA 133* 11/18/2012   K 4.5 11/18/2012   CL 102 11/18/2012   CREATININE 1.2 11/18/2012   BUN 19 11/18/2012   CO2 24 11/18/2012   HGBA1C 12.3* 11/20/2012       Assessment & Plan:  See problem list. Medications and labs reviewed today.

## 2012-12-02 NOTE — Assessment & Plan Note (Signed)
BP Readings from Last 3 Encounters:  12/02/12 120/72  11/21/12 136/93  11/18/12 140/88   The current medical regimen is effective;  continue present plan and medications.

## 2012-12-03 ENCOUNTER — Telehealth: Payer: Self-pay | Admitting: Internal Medicine

## 2012-12-03 ENCOUNTER — Encounter: Payer: Self-pay | Admitting: Internal Medicine

## 2012-12-03 NOTE — Telephone Encounter (Signed)
Rec'd from [facility] at [facility] forward 35 pages to Dr. Felicity Coyer 12/03/12

## 2012-12-11 ENCOUNTER — Ambulatory Visit: Payer: 59 | Admitting: Internal Medicine

## 2012-12-19 ENCOUNTER — Ambulatory Visit: Payer: 59 | Admitting: *Deleted

## 2012-12-19 ENCOUNTER — Telehealth: Payer: Self-pay | Admitting: Oncology

## 2012-12-19 ENCOUNTER — Other Ambulatory Visit (HOSPITAL_BASED_OUTPATIENT_CLINIC_OR_DEPARTMENT_OTHER): Payer: 59 | Admitting: Lab

## 2012-12-19 ENCOUNTER — Ambulatory Visit (HOSPITAL_BASED_OUTPATIENT_CLINIC_OR_DEPARTMENT_OTHER): Payer: 59 | Admitting: Oncology

## 2012-12-19 VITALS — BP 119/74 | HR 68 | Temp 96.9°F | Resp 18 | Ht 72.0 in | Wt 223.9 lb

## 2012-12-19 DIAGNOSIS — D649 Anemia, unspecified: Secondary | ICD-10-CM

## 2012-12-19 DIAGNOSIS — E119 Type 2 diabetes mellitus without complications: Secondary | ICD-10-CM

## 2012-12-19 DIAGNOSIS — C187 Malignant neoplasm of sigmoid colon: Secondary | ICD-10-CM

## 2012-12-19 DIAGNOSIS — L270 Generalized skin eruption due to drugs and medicaments taken internally: Secondary | ICD-10-CM

## 2012-12-19 LAB — CBC WITH DIFFERENTIAL/PLATELET
BASO%: 0.7 % (ref 0.0–2.0)
Basophils Absolute: 0 10*3/uL (ref 0.0–0.1)
EOS%: 2.6 % (ref 0.0–7.0)
HGB: 13.3 g/dL (ref 13.0–17.1)
MCH: 33.1 pg (ref 27.2–33.4)
MCHC: 33.4 g/dL (ref 32.0–36.0)
MCV: 99.1 fL — ABNORMAL HIGH (ref 79.3–98.0)
MONO%: 15.2 % — ABNORMAL HIGH (ref 0.0–14.0)
RBC: 4.03 10*6/uL — ABNORMAL LOW (ref 4.20–5.82)
RDW: 18 % — ABNORMAL HIGH (ref 11.0–14.6)
lymph#: 1.7 10*3/uL (ref 0.9–3.3)

## 2012-12-19 NOTE — Progress Notes (Signed)
   [facility]    OFFICE PROGRESS NOTE   INTERVAL HISTORY:   He returns as scheduled. He completed a final cycle of Xeloda on 12/09/2012. The skin thickening over the hands is beginning to improve. No new complaint. He is being followed closely by Dr. Felicity Coyer for diabetes. He reports better blood sugar control. His energy has improved.  Objective:  Vital signs in last 24 hours:  Blood pressure 119/74, pulse 68, temperature 96.9 F (36.1 C), temperature source Oral, resp. rate 18, height 6' (1.829 m), weight 223 lb 14.4 oz (101.56 kg).    HEENT: No thrush or ulcers Lymphatics: No cervical or supraclavicular nodes Resp: Lungs clear bilaterally Cardio: Regular rate and rhythm GI: No hepatomegaly, nontender Vascular: No leg edema  Skin: Hyperpigmentation, thickening and dry desquamation over the palms and feet. No ulcers.     Lab Results:  Lab Results  Component Value Date   WBC 4.5 12/19/2012   HGB 13.3 12/19/2012   HCT 39.9 12/19/2012   MCV 99.1* 12/19/2012   PLT 276 12/19/2012   ANC 1.9    Medications: I have reviewed the patient's current medications.  Assessment/Plan: 1. 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 He began adjuvant Xeloda on 06/30/2012. He began cycle 3 with a dose reduction on 08/11/2012. He began cycle 7 11/03/2012. Cycle 7 was prematurely discontinued after approximately 1 week due to poorly controlled blood sugars. He completed cycle 8 of Xeloda beginning on 11/24/2012. 2. Elevated preoperative CEA. Normal on 06/25/2012. 3. Subcentimeter indeterminate hepatic dome lesion on CT 05/14/2012. 4. Diabetes. He is now on insulin. 5. Anemia, likely iron deficiency. Improved. 6. Hypercholesterolemia. 7. Hypertension 8. Tiny nodular lung lesions noted on a staging CT 06/23/2012. Likely benign.Restaging CT in 6-9 months will be  considered. 9. Hand-foot syndrome secondary to Xeloda. The Xeloda was dose reduced to 1500 mg twice daily beginning with cycle 3 on 08/11/2012. Partially improved. 10. Elevated BUN/creatinine 11/10/2012-? dehydration related to hyperglycemia. Improved on 11/18/2012. 11. Weight loss-most likely related to diabetes. Improved.  Disposition:  He has completed the planned course of adjuvant Xeloda chemotherapy. I expect the hand/foot changes to resolve over the next few months. He will return for an office visit in 2 months. We will followup on the CEA from today.   Thornton Papas, MD  12/19/2012  11:50 AM

## 2012-12-22 ENCOUNTER — Telehealth: Payer: Self-pay | Admitting: *Deleted

## 2012-12-22 NOTE — Telephone Encounter (Signed)
Received message from pt's wife requesting CEA results.  Called and spoke with pt informing him per Dr. Truett Perna CEA results are normal.  Pt verbalized understanding and expressed appreciation for call.

## 2013-01-16 ENCOUNTER — Ambulatory Visit: Payer: 59 | Admitting: Internal Medicine

## 2013-01-26 ENCOUNTER — Encounter: Payer: Self-pay | Admitting: Internal Medicine

## 2013-01-26 ENCOUNTER — Ambulatory Visit (INDEPENDENT_AMBULATORY_CARE_PROVIDER_SITE_OTHER): Payer: 59 | Admitting: Internal Medicine

## 2013-01-26 ENCOUNTER — Other Ambulatory Visit (INDEPENDENT_AMBULATORY_CARE_PROVIDER_SITE_OTHER): Payer: 59

## 2013-01-26 VITALS — BP 112/72 | HR 70 | Temp 98.1°F | Wt 230.0 lb

## 2013-01-26 DIAGNOSIS — IMO0001 Reserved for inherently not codable concepts without codable children: Secondary | ICD-10-CM

## 2013-01-26 DIAGNOSIS — C187 Malignant neoplasm of sigmoid colon: Secondary | ICD-10-CM

## 2013-01-26 DIAGNOSIS — I10 Essential (primary) hypertension: Secondary | ICD-10-CM

## 2013-01-26 LAB — HEMOGLOBIN A1C: Hgb A1c MFr Bld: 7.1 % — ABNORMAL HIGH (ref 4.6–6.5)

## 2013-01-26 NOTE — Progress Notes (Signed)
  Subjective:    Patient ID: William Barber, male    DOB: [birth year], 52 y.o.   MRN: [id number]  HPI  Here today for follow up - reviewed chronic medical issues: DM2 - reports generally under good control in past with a1c <6.5; 100% compliance with metformin and Levemir + Humalog TID as rx'd - fears hypoglycemia - no nausea and vomiting or abdominal pain.  Dyslipidemia - changed statin 11/2012 due to formulary change - the patient reports compliance with medication(s) as prescribed. Denies adverse side effects.   hypertension - on ACEI - the patient reports compliance with medication(s) as prescribed. Denies adverse side effects.  Colon ca, sigmoid - dx 05/2012. Underwent LAR and chemo - following closely with onc for same  Past Medical History  Diagnosis Date  . Hypertension   . IBS (irritable bowel syndrome)   . Hypercholesterolemia   . Liver mass on CT Sep2013 05/19/2012  . 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 IBS (irritable bowel syndrome)   . Type 2 diabetes mellitus, uncontrolled     dx age 86    Review of Systems  Constitutional: Negative for fever or unexpected weight change (reversed weight loss trend since 11/2012) Respiratory: Negative for cough and shortness of breath.   Cardiovascular: Negative for chest pain or palpitations.       Objective:   Physical Exam  BP 112/72  Pulse 70  Temp(Src) 98.1 F (36.7 C) (Oral)  Wt 230 lb (104.327 kg)  BMI 31.19 kg/m2  SpO2 99% Wt Readings from Last 3 Encounters:  01/26/13 230 lb (104.327 kg)  12/19/12 223 lb 14.4 oz (101.56 kg)  12/02/12 216 lb 1.9 oz (98.031 kg)   Constitutional:  He appears well-developed and well-nourished. No distress. Neck: Normal range of motion. Neck supple. No JVD or LAD present. No thyromegaly present.  Cardiovascular: Normal rate, regular rhythm and normal heart sounds.  No murmur heard. no BLE  edema Pulmonary/Chest: Effort normal and breath sounds normal. No respiratory distress. no wheezes.  Psychiatric: he has a normal mood and affect. behavior is normal. Judgment and thought content normal.   Lab Results  Component Value Date   WBC 4.5 12/19/2012   HGB 13.3 12/19/2012   HCT 39.9 12/19/2012   PLT 276 12/19/2012   GLUCOSE 299* 11/18/2012   ALT 31 11/10/2012   AST 22 11/10/2012   NA 133* 11/18/2012   K 4.5 11/18/2012   CL 102 11/18/2012   CREATININE 1.2 11/18/2012   BUN 19 11/18/2012   CO2 24 11/18/2012   HGBA1C 12.3* 11/20/2012       Assessment & Plan:  See problem list. Medications and labs reviewed today.

## 2013-01-26 NOTE — Assessment & Plan Note (Signed)
Reviewed hx and home cbgs (range 80-180s); transfered care here from [facility] 11/2012 Added basal insulin Levemir 11/08/12, titrate up until AM cbg<150 Continue metformin and Humalog TID Check a1c q71mo   Lab Results  Component Value Date   HGBA1C 12.3* 11/20/2012

## 2013-01-26 NOTE — Assessment & Plan Note (Signed)
BP Readings from Last 3 Encounters:  01/26/13 112/72  12/19/12 119/74  12/02/12 120/72   The current medical regimen is effective;  continue present plan and medications.

## 2013-01-26 NOTE — Patient Instructions (Signed)
It was good to see you today. We have reviewed your prior records including labs and tests today Medications reviewed and updated, no changes recommended at this time -  call if AM fasting sugar <150 to reduce Levemir dose  Continue 5-10 units Humalog meal coverage 3x/day and metformin 2x/day until further notice Call/email via MyChart if cbg >200 or <70 at any time for other adjustments as needed Please schedule followup in 3 months with me, call sooner if problems. Bring your sugar logbook to this appointment for review Work on lifestyle changes as discussed (low fat, low carb, increased protein diet; improved exercise efforts; weight loss) to control sugar, blood pressure and cholesterol levels and/or reduce risk of developing other medical problems. Look into [email or web address].[email or web address] or other type of food journal to assist you in this process.

## 2013-01-26 NOTE — Assessment & Plan Note (Signed)
Interval hx reviewed - Completed 12/2012 Xeloda (interruption due to extreme hyperglycemia)  Follows with onc regularly;  continue present plan and medications.

## 2013-02-03 ENCOUNTER — Other Ambulatory Visit: Payer: Self-pay | Admitting: *Deleted

## 2013-02-03 MED ORDER — "INSULIN SYRINGE 30G X 5/16"" 1 ML MISC": Status: DC

## 2013-02-05 ENCOUNTER — Other Ambulatory Visit: Payer: Self-pay | Admitting: *Deleted

## 2013-02-05 MED ORDER — ENALAPRIL MALEATE 20 MG PO TABS: 20.0000 mg | ORAL_TABLET | Freq: Every morning | ORAL | Status: DC

## 2013-02-13 ENCOUNTER — Telehealth: Payer: Self-pay | Admitting: Oncology

## 2013-02-13 ENCOUNTER — Ambulatory Visit (HOSPITAL_BASED_OUTPATIENT_CLINIC_OR_DEPARTMENT_OTHER): Payer: 59 | Admitting: Oncology

## 2013-02-13 VITALS — BP 136/80 | HR 93 | Temp 98.2°F | Resp 18 | Ht 72.0 in | Wt 232.9 lb

## 2013-02-13 DIAGNOSIS — D649 Anemia, unspecified: Secondary | ICD-10-CM

## 2013-02-13 DIAGNOSIS — C19 Malignant neoplasm of rectosigmoid junction: Secondary | ICD-10-CM

## 2013-02-13 DIAGNOSIS — E119 Type 2 diabetes mellitus without complications: Secondary | ICD-10-CM

## 2013-02-13 DIAGNOSIS — C187 Malignant neoplasm of sigmoid colon: Secondary | ICD-10-CM

## 2013-02-13 NOTE — Progress Notes (Signed)
   [facility]    OFFICE PROGRESS NOTE   INTERVAL HISTORY:   He returns as scheduled. He reports improvement in the hyperpigmentation and skin breakdown over the hands and feet. The diabetes is under better control. He continues to work.  Objective:  Vital signs in last 24 hours:  Blood pressure 136/80, pulse 93, temperature 98.2 F (36.8 C), temperature source Oral, resp. rate 18, height 6' (1.829 m), weight 232 lb 14.4 oz (105.643 kg), SpO2 100.00%.    HEENT: Neck without mass Lymphatics: 1/2 cm low left posterior cervical node,? Pea-sized right posterior cervical node, no supra-clavicular, axillary, or inguinal nodes Resp: Lungs clear bilaterally Cardio: Regular rate and rhythm GI: No hepatomegaly, nontender, no mass Vascular: No leg edema  Skin: Mild hyperpigmentation of the hands and feet. Calcification and skin thickening at the palms and soles. No ulcers or skin breakdown.     Lab Results:  Lab Results  Component Value Date   WBC 4.5 12/19/2012   HGB 13.3 12/19/2012   HCT 39.9 12/19/2012   MCV 99.1* 12/19/2012   PLT 276 12/19/2012   CEA on 12/19/2012-0.9   Medications: I have reviewed the patient's current medications.  Assessment/Plan: 1. 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 He began adjuvant Xeloda on 06/30/2012. He began cycle 3 with a dose reduction on 08/11/2012. He began cycle 7 11/03/2012. Cycle 7 was prematurely discontinued after approximately 1 week due to poorly controlled blood sugars. He completed cycle 8 of Xeloda beginning on 11/24/2012. 2. Elevated preoperative CEA. Normal on 06/25/2012. 3. Subcentimeter indeterminate hepatic dome lesion on CT 05/14/2012. 4. Diabetes.  5. Anemia, likely iron deficiency. Improved. 6. Hypercholesterolemia. 7. Hypertension 8. Tiny nodular lung lesions noted on a staging CT 06/23/2012.  Likely benign.Restaging CT will be considered after the next office visit. 9. Hand-foot syndrome secondary to Xeloda. The Xeloda was dose reduced to 1500 mg twice daily beginning with cycle 3 on 08/11/2012.  10. Elevated BUN/creatinine 11/10/2012-? dehydration related to hyperglycemia. Improved on 11/18/2012. 11. Weight loss-most likely related to diabetes. Improved.   Disposition:  He remains in clinical remission from colon cancer. William Barber will return for an office visit and CEA in September of 2014. We will refer him to Dr. Ewing Schlein for a one-year surveillance colonoscopy.   Thornton Papas, MD  02/13/2013  8:58 AM

## 2013-02-13 NOTE — Telephone Encounter (Signed)
gv and printed appt sched and avs for pt...per Corrie Dandy at Dr. Marlane Hatcher office pt needs to call billing to schedule an appt...pt ok and awre

## 2013-04-29 ENCOUNTER — Encounter: Payer: Self-pay | Admitting: Internal Medicine

## 2013-04-29 ENCOUNTER — Other Ambulatory Visit (INDEPENDENT_AMBULATORY_CARE_PROVIDER_SITE_OTHER): Payer: 59

## 2013-04-29 ENCOUNTER — Ambulatory Visit (INDEPENDENT_AMBULATORY_CARE_PROVIDER_SITE_OTHER): Payer: 59 | Admitting: Internal Medicine

## 2013-04-29 VITALS — BP 120/78 | HR 67 | Temp 98.0°F | Wt 241.1 lb

## 2013-04-29 DIAGNOSIS — E7800 Pure hypercholesterolemia, unspecified: Secondary | ICD-10-CM

## 2013-04-29 DIAGNOSIS — IMO0001 Reserved for inherently not codable concepts without codable children: Secondary | ICD-10-CM

## 2013-04-29 DIAGNOSIS — C187 Malignant neoplasm of sigmoid colon: Secondary | ICD-10-CM

## 2013-04-29 DIAGNOSIS — I10 Essential (primary) hypertension: Secondary | ICD-10-CM

## 2013-04-29 DIAGNOSIS — Z79899 Other long term (current) drug therapy: Secondary | ICD-10-CM

## 2013-04-29 LAB — MICROALBUMIN / CREATININE URINE RATIO
Creatinine,U: 164.6 mg/dL
Microalb Creat Ratio: 0.5 mg/g (ref 0.0–30.0)

## 2013-04-29 LAB — HEPATIC FUNCTION PANEL
ALT: 28 U/L (ref 0–53)
Albumin: 3.9 g/dL (ref 3.5–5.2)
Alkaline Phosphatase: 50 U/L (ref 39–117)
Total Protein: 7.7 g/dL (ref 6.0–8.3)

## 2013-04-29 LAB — BASIC METABOLIC PANEL
BUN: 15 mg/dL (ref 6–23)
CO2: 29 mEq/L (ref 19–32)
Calcium: 9.3 mg/dL (ref 8.4–10.5)
Glucose, Bld: 111 mg/dL — ABNORMAL HIGH (ref 70–99)
Potassium: 4 mEq/L (ref 3.5–5.1)
Sodium: 138 mEq/L (ref 135–145)

## 2013-04-29 LAB — LIPID PANEL
Cholesterol: 166 mg/dL (ref 0–200)
HDL: 40.4 mg/dL (ref >39.00)

## 2013-04-29 MED ORDER — INSULIN DETEMIR 100 UNIT/ML ~~LOC~~ SOLN: 25.0000 [IU] | Freq: Every day | SUBCUTANEOUS | Status: DC

## 2013-04-29 NOTE — Progress Notes (Signed)
  Subjective:    Patient ID: William Barber, male    DOB: Aug 13, 1961, 52 y.o.   MRN: [id number]  HPI  Here today for follow up - reviewed chronic medical issues: DM2 - reports generally under good control in past with a1c <6.5; 100% compliance with metformin and Levemir + Humalog TID as rx'd - fears hypoglycemia - no nausea and vomiting or abdominal pain.  Dyslipidemia - changed statin 11/2012 due to formulary change - the patient reports compliance with medication(s) as prescribed. Denies adverse side effects.   hypertension - on ACEI - the patient reports compliance with medication(s) as prescribed. Denies adverse side effects.  Colon ca, sigmoid - dx 05/2012. Underwent LAR and chemo - following closely with onc for same  Past Medical History  Diagnosis Date  . Hypertension   . IBS (irritable bowel syndrome)   . Hypercholesterolemia   . Liver mass on CT Sep2013 05/19/2012  . 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 IBS (irritable bowel syndrome)   . Type 2 diabetes mellitus, uncontrolled     dx age 65    Review of Systems  Constitutional: Negative for fever or unexpected weight change (reversed weight loss trend since 11/2012) Respiratory: Negative for cough and shortness of breath.   Cardiovascular: Negative for chest pain or palpitations.       Objective:   Physical Exam  BP 120/78  Pulse 67  Temp(Src) 98 F (36.7 C) (Oral)  Wt 241 lb 1.9 oz (109.371 kg)  BMI 32.69 kg/m2  SpO2 97% Wt Readings from Last 3 Encounters:  04/29/13 241 lb 1.9 oz (109.371 kg)  02/13/13 232 lb 14.4 oz (105.643 kg)  01/26/13 230 lb (104.327 kg)   Constitutional:  He is overweight, but appears well-developed and well-nourished. No distress. Neck: Normal range of motion. Neck supple. No JVD or LAD present. No thyromegaly present.  Cardiovascular: Normal rate, regular rhythm and normal heart  sounds.  No murmur heard. no BLE edema Pulmonary/Chest: Effort normal and breath sounds normal. No respiratory distress. no wheezes.  Psychiatric: he has a normal mood and affect. behavior is normal. Judgment and thought content normal.   Lab Results  Component Value Date   WBC 4.5 12/19/2012   HGB 13.3 12/19/2012   HCT 39.9 12/19/2012   PLT 276 12/19/2012   GLUCOSE 299* 11/18/2012   ALT 31 11/10/2012   AST 22 11/10/2012   NA 133* 11/18/2012   K 4.5 11/18/2012   CL 102 11/18/2012   CREATININE 1.2 11/18/2012   BUN 19 11/18/2012   CO2 24 11/18/2012   HGBA1C 7.1* 01/26/2013       Assessment & Plan:  See problem list. Medications and labs reviewed today.

## 2013-04-29 NOTE — Assessment & Plan Note (Signed)
Reviewed hx and home cbgs (range 80-180s); transfered care here from [facility] 11/2012 Added basal insulin Levemir 11/08/12, titrate until AM cbg<150 Continue metformin and Humalog TID Also on statin and ACEI Check a1c q67mo   Lab Results  Component Value Date   HGBA1C 7.1* 01/26/2013

## 2013-04-29 NOTE — Progress Notes (Signed)
Subjective:    Patient ID: William Barber, male    DOB: 04-03-1961, 52 y.o.   MRN: [id number]  HPI Patient presents to follow up for chronic medical conditions.  DM Type II- Currently compliant on metformin, Levemir and Humalog tid. Diet has not been ideal lately due to hectic schedule. Last A1C was improved. Denies polydipsia, polyuria, numbness/tingling, and significant hypoglycemic episodes. Scheduled to see eye doctor within next few weeks.  Hypertension- Currently controlled on Vasotec.   Cancer of sigmoid colon- S/p colon resection and chemo- in remission, continues to follow with oncology. Due for colonoscopy in 2-3 weeks  Hyperlipidemia- Statin was changed to lovastatin in March due to formulary change. Patient reports compliance and no ADRs with medication.  Past Medical History  Diagnosis Date  . Hypertension   . IBS (irritable bowel syndrome)   . Hypercholesterolemia   . Liver mass on CT Sep2013 05/19/2012  . 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 IBS (irritable bowel syndrome)   . Type 2 diabetes mellitus, uncontrolled     dx age 45   Family History  Problem Relation Age of Onset  . Cancer Sister     cervical cancer  . Cancer Paternal Grandfather     colon cancer   History  Substance Use Topics  . Smoking status: Former Smoker    Types: Cigarettes    Quit date: 06/25/1985  . Smokeless tobacco: Never Used     Comment: 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 [employer]  . Alcohol Use: Yes     Comment: beer occasionally     Review of Systems  Constitutional: Negative for fever and unexpected weight change.  Respiratory: Negative for cough and shortness of breath.   Cardiovascular: Negative for chest pain and leg swelling.  Gastrointestinal: Negative for nausea, vomiting and abdominal pain.  Skin: Negative for rash.  Neurological: Negative for dizziness and headaches.  Otherwise  negative or as stated in HPI    Objective:   Physical Exam  Constitutional: He is oriented to person, place, and time. He appears well-developed and well-nourished.  HENT:  Head: Normocephalic and atraumatic.  Eyes: Conjunctivae and EOM are normal. Pupils are equal, round, and reactive to light.  Neck: Normal range of motion. Neck supple.  Cardiovascular: Normal rate and regular rhythm.   Pulmonary/Chest: Breath sounds normal. He has no wheezes. He has no rales.  Neurological: He is alert and oriented to person, place, and time. No cranial nerve deficit.  Skin: Skin is warm and dry.  Psychiatric: He has a normal mood and affect.   Lab Results  Component Value Date   WBC 4.5 12/19/2012   HGB 13.3 12/19/2012   HCT 39.9 12/19/2012   PLT 276 12/19/2012   GLUCOSE 299* 11/18/2012   ALT 31 11/10/2012   AST 22 11/10/2012   NA 133* 11/18/2012   K 4.5 11/18/2012   CL 102 11/18/2012   CREATININE 1.2 11/18/2012   BUN 19 11/18/2012   CO2 24 11/18/2012   HGBA1C 7.1* 01/26/2013         Assessment & Plan:  1. DM II- Will obtain A1C and urine microalbumin/creatinine today. Patient should continue with current regimen and low carb diabetic diet and exercise is encouraged. Follow up in 3 months, or sooner if problems arise.  2. HTN- continue current treatment.  3. Cancer of sigmoid colon- continue to follow with oncology recommendations.  4. Hyperlipidemia- Will check lipid panel  and LFTs today with change in statin. Continue with lovastatin.   Concha Se, [place]ighbor  I have personally reviewed this case with PA student. I also personally examined this patient. I agree with history and findings as documented above. I reviewed, discussed and approve of the assessment and plan as listed above. Rene Paci, MD

## 2013-04-29 NOTE — Assessment & Plan Note (Signed)
Interval hx reviewed - Completed 12/2012 Xeloda (interruption due to extreme hyperglycemia)  Follows with onc regularly;  continue present plan and medications.  follow up colo for annual surveillance scheduled 05/2013

## 2013-04-29 NOTE — Assessment & Plan Note (Signed)
Changed 11/2012 to lovastatin due to formulary change, prev on prava Check lipids now and annually, titrate as needed

## 2013-04-29 NOTE — Assessment & Plan Note (Signed)
BP Readings from Last 3 Encounters:  04/29/13 120/78  02/13/13 136/80  01/26/13 112/72   The current medical regimen is effective;  continue present plan and medications.

## 2013-04-29 NOTE — Patient Instructions (Addendum)
It was good to see you today. We have reviewed your prior records including labs and tests today Test(s) ordered today. Your results will be released to MyChart (or called to you) after review, usually within 72hours after test completion. If any changes need to be made, you will be notified at that same time. Medications reviewed and updated, no changes recommended at this time -  call if AM fasting sugar <150 to reduce Levemir dose  Call/email via MyChart if cbg >200 or <70 at any time for other adjustments as needed Please schedule followup in 3-6 months with me, call sooner if problems.  Work on lifestyle changes as discussed (low fat, low carb, increased protein diet; improved exercise efforts; weight loss) to control sugar, blood pressure and cholesterol levels and/or reduce risk of developing other medical problems. Look into [email or web address].com.cy or other type of food journal to assist you in this process.

## 2013-06-05 ENCOUNTER — Other Ambulatory Visit: Payer: Self-pay | Admitting: *Deleted

## 2013-06-05 ENCOUNTER — Other Ambulatory Visit: Payer: 59 | Admitting: Lab

## 2013-06-05 ENCOUNTER — Ambulatory Visit: Payer: 59 | Admitting: Oncology

## 2013-06-05 NOTE — Progress Notes (Signed)
[facility] for lab/OV today. POF to scheduler to reschedule.

## 2013-06-09 ENCOUNTER — Telehealth: Payer: Self-pay | Admitting: Oncology

## 2013-06-09 NOTE — Telephone Encounter (Signed)
lmonvm for pt re appt for 11/17. Per 9/26 pof pt [facility] for 9/26 appts r/s 1-2 mos out. Schedule mailed.

## 2013-07-27 ENCOUNTER — Telehealth: Payer: Self-pay | Admitting: Oncology

## 2013-07-27 ENCOUNTER — Ambulatory Visit (HOSPITAL_BASED_OUTPATIENT_CLINIC_OR_DEPARTMENT_OTHER): Payer: 59 | Admitting: Oncology

## 2013-07-27 ENCOUNTER — Encounter (INDEPENDENT_AMBULATORY_CARE_PROVIDER_SITE_OTHER): Payer: Self-pay

## 2013-07-27 ENCOUNTER — Other Ambulatory Visit: Payer: 59 | Admitting: Lab

## 2013-07-27 VITALS — BP 141/83 | HR 79 | Temp 98.4°F | Resp 18 | Ht 72.0 in | Wt 240.9 lb

## 2013-07-27 DIAGNOSIS — C187 Malignant neoplasm of sigmoid colon: Secondary | ICD-10-CM

## 2013-07-27 DIAGNOSIS — I10 Essential (primary) hypertension: Secondary | ICD-10-CM

## 2013-07-27 DIAGNOSIS — E119 Type 2 diabetes mellitus without complications: Secondary | ICD-10-CM

## 2013-07-27 DIAGNOSIS — D649 Anemia, unspecified: Secondary | ICD-10-CM

## 2013-07-27 NOTE — Progress Notes (Addendum)
   [facility]    OFFICE PROGRESS NOTE   INTERVAL HISTORY:   Mr. William Barber returns for a scheduled visit. Good appetite and energy level. He feels like he is getting a "cold "with a "scratchy "throat and sinus congestion.  Objective:  Vital signs in last 24 hours:  Blood pressure 141/83, pulse 79, temperature 98.4 F (36.9 C), temperature source Oral, resp. rate 18, height 6' (1.829 m), weight 240 lb 14.4 oz (109.272 kg).    HEENT: No sinus tenderness, pharynx without erythema Lymphatics: No cervical, supraclavicular, axillary, or inguinal nodes Resp: Lungs clear bilaterally Cardio: Regular rate and rhythm GI: No hepatomegaly, nontender, no mass Vascular: No leg edema Skin: Hands without hyperpigmentation    Lab Results: CEA pending   Medications: I have reviewed the patient's current medications.  Assessment/Plan: 1. 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 He began adjuvant Xeloda on 06/30/2012. He began cycle 3 with a dose reduction on 08/11/2012. He began cycle 7 11/03/2012. Cycle 7 was prematurely discontinued after approximately 1 week due to poorly controlled blood sugars. He completed cycle 8 of Xeloda beginning on 11/24/2012. 2. Elevated preoperative CEA. Normal on 06/25/2012. 3. Subcentimeter indeterminate hepatic dome lesion on CT 05/14/2012. Felt to be benign on review of the CT with a radiologist 07/28/2013 4. Diabetes.  5. History of Anemia, likely iron deficiency. 6. Hypercholesterolemia. 7. Hypertension 8. Tiny nodular lung lesions noted on a staging CT 06/23/2012. Likely benign.I reviewed the CT with a [facility] radiologist on 07/28/2013-the right lung nodule appears to be a lymph node, tiny subpleural nodule in the left lower lobe is likely a benign finding. 9.  Hand-foot syndrome secondary to Xeloda. The Xeloda was dose reduced to  1500 mg twice daily beginning with cycle 3 on 08/11/2012.  10. Elevated BUN/creatinine 11/10/2012-? dehydration related to hyperglycemia. Improved on 11/18/2012. 11. Weight loss-most likely related to diabetes. Improved.  Disposition:  William Barber remains in clinical remission from colon cancer. He is scheduling a surveillance colonoscopy with Dr. Ewing Schlein. He continues followup with Dr. Felicity Coyer for management of diabetes. He will return for an office visit and CEA in 6 months.  He plans to obtain an influenza vaccine when he recovers from the current upper respiratory infection.      Thornton Papas, MD  07/27/2013  11:46 AM

## 2013-07-27 NOTE — Telephone Encounter (Signed)
appts made per 11/17 POF AVS and cal given to pt shh

## 2013-07-29 ENCOUNTER — Telehealth: Payer: Self-pay | Admitting: *Deleted

## 2013-07-29 NOTE — Telephone Encounter (Signed)
Message copied by Caleb Popp on Wed Jul 29, 2013 10:22 AM ------      Message from: Ladene Artist      Created: Tue Jul 28, 2013  9:43 PM       Please call patient, cea is normal, f/u as scheduled ------

## 2013-07-30 ENCOUNTER — Ambulatory Visit: Payer: 59 | Admitting: Internal Medicine

## 2013-07-30 ENCOUNTER — Telehealth: Payer: Self-pay | Admitting: *Deleted

## 2013-07-30 ENCOUNTER — Other Ambulatory Visit: Payer: Self-pay | Admitting: Oncology

## 2013-07-30 NOTE — Telephone Encounter (Signed)
Made patient aware of normal CEA and to follow up as scheduled per MD.

## 2013-08-12 ENCOUNTER — Ambulatory Visit: Payer: 59 | Admitting: Internal Medicine

## 2013-08-17 ENCOUNTER — Ambulatory Visit: Payer: 59 | Admitting: Internal Medicine

## 2013-08-20 ENCOUNTER — Ambulatory Visit (INDEPENDENT_AMBULATORY_CARE_PROVIDER_SITE_OTHER): Payer: 59 | Admitting: Internal Medicine

## 2013-08-20 ENCOUNTER — Other Ambulatory Visit (INDEPENDENT_AMBULATORY_CARE_PROVIDER_SITE_OTHER): Payer: 59

## 2013-08-20 ENCOUNTER — Encounter: Payer: Self-pay | Admitting: Internal Medicine

## 2013-08-20 VITALS — BP 110/72 | HR 70 | Temp 98.2°F | Wt 243.0 lb

## 2013-08-20 DIAGNOSIS — I10 Essential (primary) hypertension: Secondary | ICD-10-CM

## 2013-08-20 DIAGNOSIS — E7800 Pure hypercholesterolemia, unspecified: Secondary | ICD-10-CM

## 2013-08-20 DIAGNOSIS — IMO0001 Reserved for inherently not codable concepts without codable children: Secondary | ICD-10-CM

## 2013-08-20 DIAGNOSIS — C187 Malignant neoplasm of sigmoid colon: Secondary | ICD-10-CM

## 2013-08-20 NOTE — Assessment & Plan Note (Signed)
BP Readings from Last 3 Encounters:  08/20/13 110/72  07/27/13 141/83  04/29/13 120/78   The current medical regimen is effective;  continue present plan and medications.

## 2013-08-20 NOTE — Assessment & Plan Note (Signed)
Changed 11/2012 to lovastatin due to formulary change, prev on prava Check lipids annually, titrate as needed

## 2013-08-20 NOTE — Assessment & Plan Note (Signed)
Reviewed hx and home cbgs (range 80-180s); transfered care here from [facility] 11/2012 Added basal insulin Levemir 11/08/12, titrate until AM cbg<150 Continue metformin and Humalog TID Also on statin and ACEI Check a1c q69mo   Lab Results  Component Value Date   HGBA1C 6.2 04/29/2013

## 2013-08-20 NOTE — Progress Notes (Signed)
  Subjective:    Patient ID: William Barber, male    DOB: 1961-02-11, 52 y.o.   MRN: [id number]  HPI  Here today for follow up - reviewed chronic medical issues: DM2 - reports generally under good control in past with a1c <6.5; 100% compliance with metformin and Levemir + Humalog TID as rx'd - fears hypoglycemia - no nausea and vomiting or abdominal pain.  Dyslipidemia - changed statin 11/2012 due to formulary change - the patient reports compliance with medication(s) as prescribed. Denies adverse side effects.   hypertension - on ACEI - the patient reports compliance with medication(s) as prescribed. Denies adverse side effects.  Colon ca, sigmoid - dx 05/2012. Underwent LAR and chemo - following closely with onc for same  Past Medical History  Diagnosis Date  . Hypertension   . IBS (irritable bowel syndrome)   . Hypercholesterolemia   . Liver mass on CT Sep2013 05/19/2012  . 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 IBS (irritable bowel syndrome)   . Type 2 diabetes mellitus, uncontrolled     dx age 34    Review of Systems  Constitutional: Positive for fatigue. Negative for fever and unexpected weight change.  Respiratory: Negative for shortness of breath and wheezing.   Cardiovascular: Negative for leg swelling.  Endocrine: Positive for polyphagia. Negative for polydipsia and polyuria.  Musculoskeletal:       Occ hand cramps        Objective:   Physical Exam BP 110/72  Pulse 70  Temp(Src) 98.2 F (36.8 C) (Oral)  Wt 243 lb (110.224 kg)  SpO2 97% Wt Readings from Last 3 Encounters:  08/20/13 243 lb (110.224 kg)  07/27/13 240 lb 14.4 oz (109.272 kg)  04/29/13 241 lb 1.9 oz (109.371 kg)   Constitutional:  He is overweight, but appears well-developed and well-nourished. No distress. Neck: Normal range of motion. Neck supple. No JVD or LAD present. No thyromegaly present.   Cardiovascular: Normal rate, regular rhythm and normal heart sounds.  No murmur heard. no BLE edema Pulmonary/Chest: Effort normal and breath sounds normal. No respiratory distress. no wheezes.  Psychiatric: he has a normal mood and affect. behavior is normal. Judgment and thought content normal.   Lab Results  Component Value Date   WBC 4.5 12/19/2012   HGB 13.3 12/19/2012   HCT 39.9 12/19/2012   PLT 276 12/19/2012   GLUCOSE 111* 04/29/2013   CHOL 166 04/29/2013   TRIG 119.0 04/29/2013   HDL 40.40 04/29/2013   LDLCALC 102* 04/29/2013   ALT 28 04/29/2013   AST 27 04/29/2013   NA 138 04/29/2013   K 4.0 04/29/2013   CL 103 04/29/2013   CREATININE 1.3 04/29/2013   BUN 15 04/29/2013   CO2 29 04/29/2013   HGBA1C 6.2 04/29/2013   MICROALBUR 0.9 04/29/2013       Assessment & Plan:  See problem list. Medications and labs reviewed today.

## 2013-08-20 NOTE — Assessment & Plan Note (Signed)
Interval hx reviewed - Completed 12/2012 Xeloda (interruption due to extreme hyperglycemia)  Follows with onc regularly;  continue present plan and medications.  follow up colo with [facility] for annual surveillance as planned

## 2013-08-20 NOTE — Patient Instructions (Addendum)
It was good to see you today.  We have reviewed your prior records including labs and tests today  Test(s) ordered today. Your results will be released to MyChart (or called to you) after review, usually within 72hours after test completion. If any changes need to be made, you will be notified at that same time.  Medications reviewed and updated, no changes recommended at this time -   Call/email via MyChart if cbg >200 or <70 at any time for insulin/Levemir adjustments as needed  Please schedule followup in 6 months, call sooner if problems.  Continue to work on lifestyle changes as discussed (low fat, low carb, increased protein diet; improved exercise efforts; weight loss) to control sugar, blood pressure and cholesterol levels and/or reduce risk of developing other medical problems. Look into [email or web address]cy or other type of food journal to assist you in this process.  Diabetes and Standards of Medical Care  Diabetes is complicated. You may find that your diabetes team includes a dietitian, nurse, diabetes educator, eye doctor, and more. To help everyone know what is going on and to help you get the care you deserve, the following schedule of care was developed to help keep you on track. Below are the tests, exams, vaccines, medicines, education, and plans you will need. HbA1c test This test shows how well you have controlled your glucose over the past 2 3 months. It is used to see if your diabetes management plan needs to be adjusted.   It is performed at least 2 times a year if you are meeting treatment goals.  It is performed 4 times a year if therapy has changed or if you are not meeting treatment goals. Blood pressure test  This test is performed at every routine medical visit. The goal is less than 140/90 mmHg for most people, but 130/80 mmHg in some cases. Ask your health care provider about your goal. Dental exam  Follow up with the dentist regularly. Eye exam  If you are  diagnosed with type 1 diabetes as a child, get an exam upon reaching the age of 10 years or older and have had diabetes for 3 5 years. Yearly eye exams are recommended after that initial eye exam.  If you are diagnosed with type 1 diabetes as an adult, get an exam within 5 years of diagnosis and then yearly.  If you are diagnosed with type 2 diabetes, get an exam as soon as possible after the diagnosis and then yearly. Foot care exam  Visual foot exams are performed at every routine medical visit. The exams check for cuts, injuries, or other problems with the feet.  A comprehensive foot exam should be done yearly. This includes visual inspection as well as assessing foot pulses and testing for loss of sensation.  Check your feet nightly for cuts, injuries, or other problems with your feet. Tell your health care provider if anything is not healing. Kidney function test (urine microalbumin)  This test is performed once a year.  Type 1 diabetes: The first test is performed 5 years after diagnosis.  Type 2 diabetes: The first test is performed at the time of diagnosis.  A serum creatinine and estimated glomerular filtration rate (eGFR) test is done once a year to assess the level of chronic kidney disease (CKD), if present. Lipid profile (cholesterol, HDL, LDL, triglycerides)  Performed every 5 years for most people.  The goal for LDL is less than 100 mg/dL. If you are at high risk, the goal  is less than 70 mg/dL.  The goal for HDL is 40 mg/dL 50 mg/dL for men and 50 mg/dL 60 mg/dL for women. An HDL cholesterol of 60 mg/dL or higher gives some protection against heart disease.  The goal for triglycerides is less than 150 mg/dL. Influenza vaccine, pneumococcal vaccine, and hepatitis B vaccine  The influenza vaccine is recommended yearly.  The pneumococcal vaccine is generally given once in a lifetime. However, there are some instances when another vaccination is recommended. Check with  your health care provider.  The hepatitis B vaccine is also recommended for adults with diabetes. Diabetes self-management education  Education is recommended at diagnosis and ongoing as needed. Treatment plan  Your treatment plan is reviewed at every medical visit. Document Released: 06/24/2009 Document Revised: 04/29/2013 Document Reviewed: 01/27/2013 [facility] Patient Information 2014 [place], [place].

## 2013-08-20 NOTE — Progress Notes (Signed)
Pre-visit discussion using our clinic review tool. No additional management support is needed unless otherwise documented below in the visit note.  

## 2013-10-17 ENCOUNTER — Other Ambulatory Visit: Payer: Self-pay | Admitting: Internal Medicine

## 2013-12-01 ENCOUNTER — Other Ambulatory Visit: Payer: Self-pay | Admitting: Internal Medicine

## 2013-12-02 ENCOUNTER — Other Ambulatory Visit: Payer: Self-pay | Admitting: *Deleted

## 2013-12-02 MED ORDER — METFORMIN HCL 500 MG PO TABS: 500.0000 mg | ORAL_TABLET | Freq: Every day | ORAL | Status: DC

## 2014-01-03 ENCOUNTER — Other Ambulatory Visit: Payer: Self-pay | Admitting: Internal Medicine

## 2014-01-28 ENCOUNTER — Telehealth: Payer: Self-pay | Admitting: Oncology

## 2014-01-28 NOTE — Telephone Encounter (Signed)
pt called to cx appt can not make...will call back to r/s

## 2014-01-29 ENCOUNTER — Other Ambulatory Visit: Payer: 59

## 2014-01-29 ENCOUNTER — Ambulatory Visit: Payer: 59 | Admitting: Nurse Practitioner

## 2014-01-31 IMAGING — US US ABDOMEN COMPLETE
1 series · 14 of 25 positions shown · non-contrast
Comparison: None.

CLINICAL DATA: Abdominal pain.  Hypertension.  Diabetes.  High
cholesterol.

COMPLETE ABDOMINAL ULTRASOUND

[Series 1: us abdomen complete · 0.23mm/px · 14 of 62 slices shown]
[im 1/62]
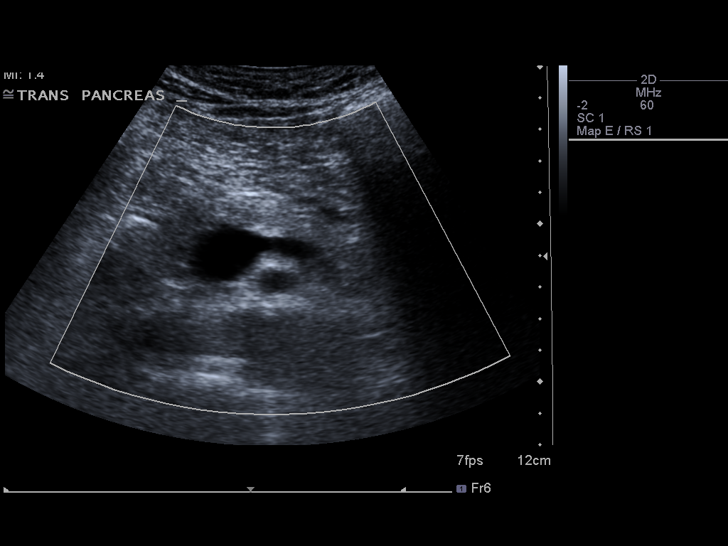
[im 6/62]
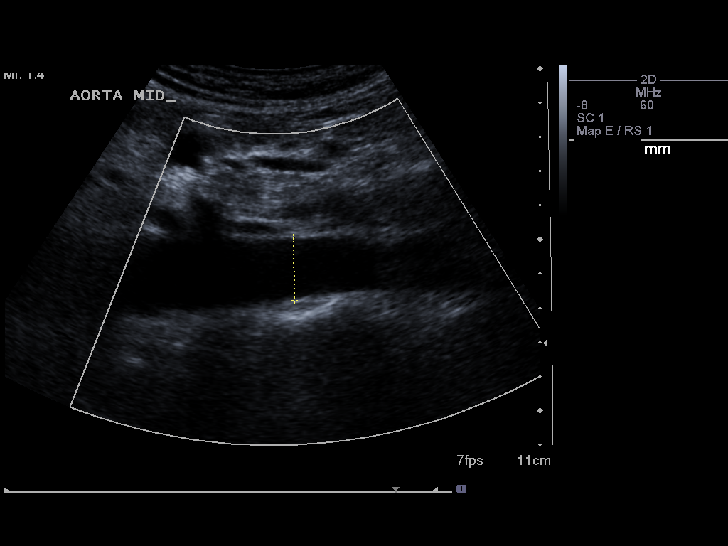
[im 11/62]
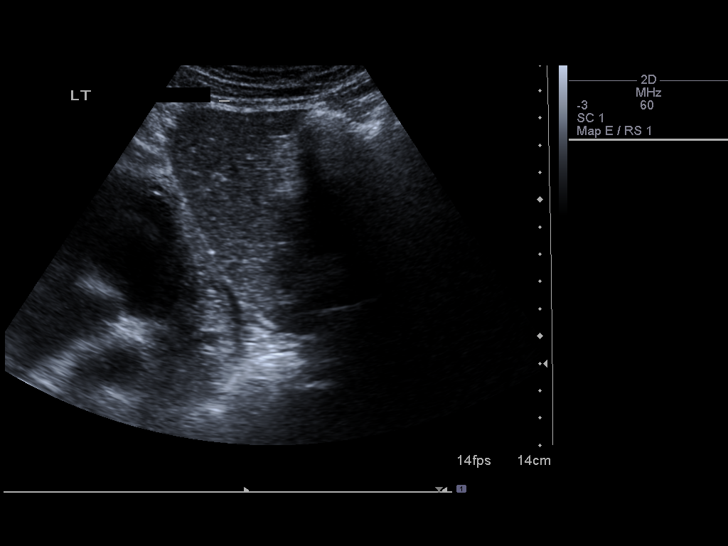
[im 16/62]
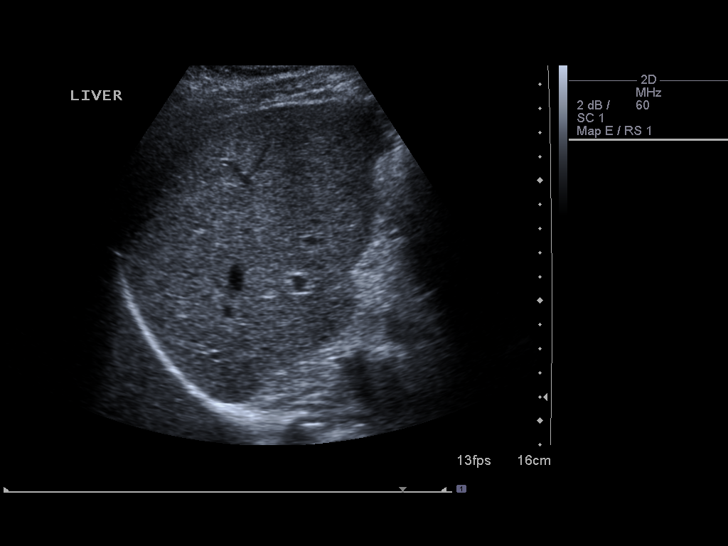
[im 21/62]
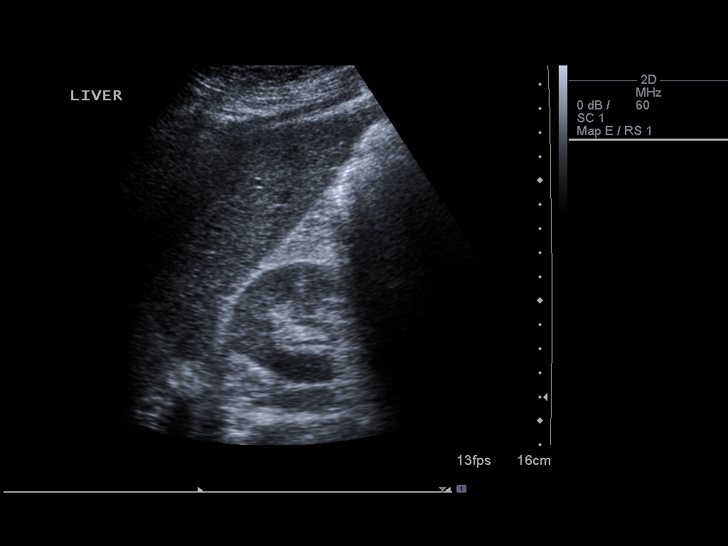
[im 23/62]
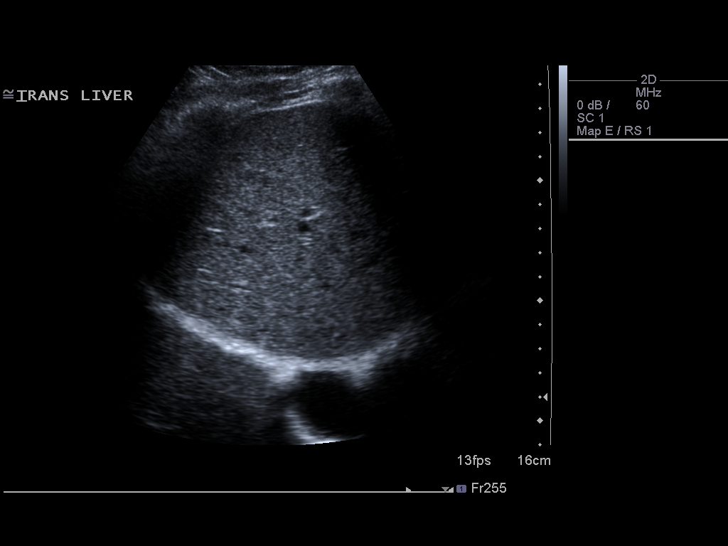
[im 28/62]
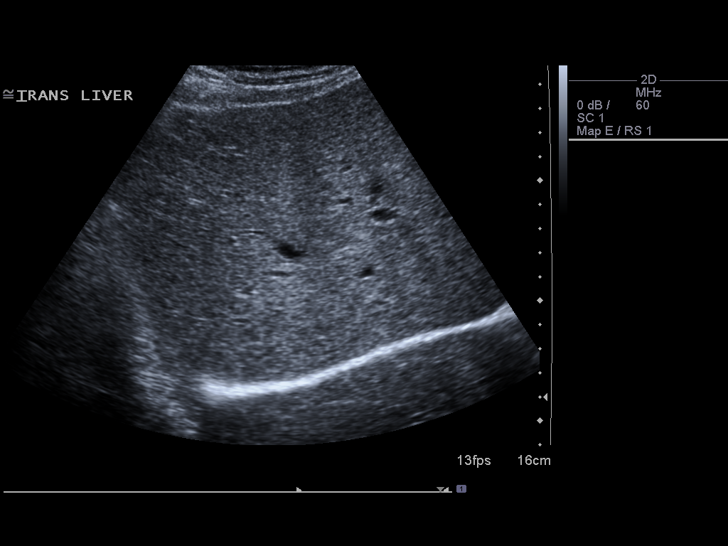
[im 34/62]
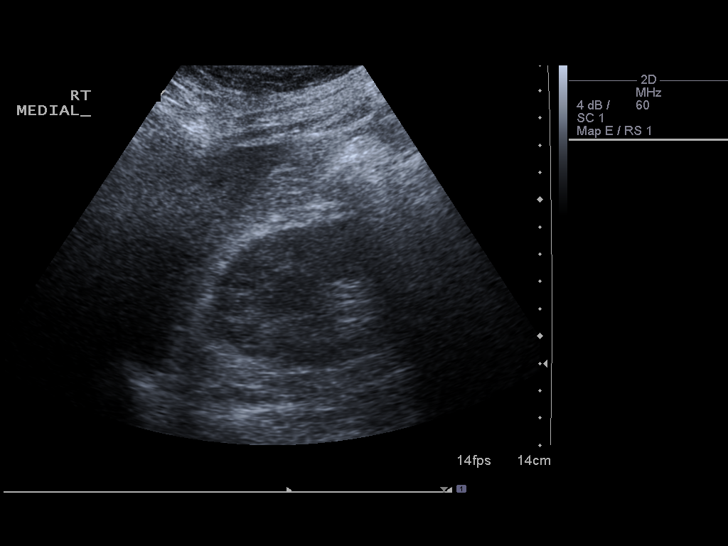
[im 39/62]
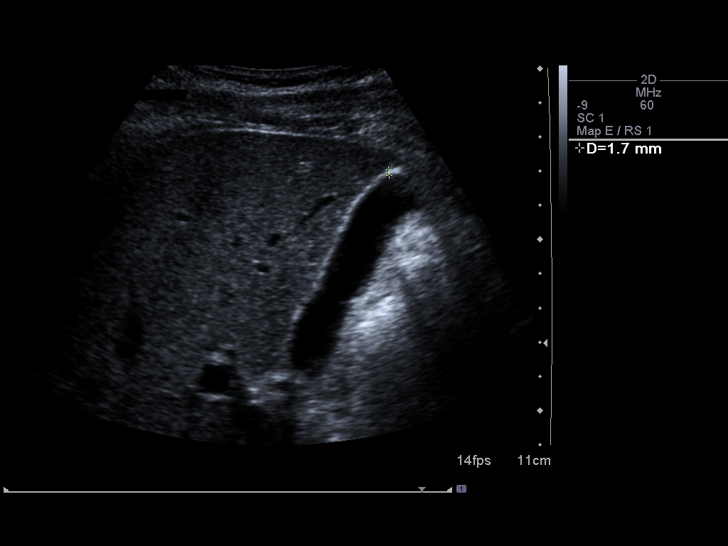
[im 41/62]
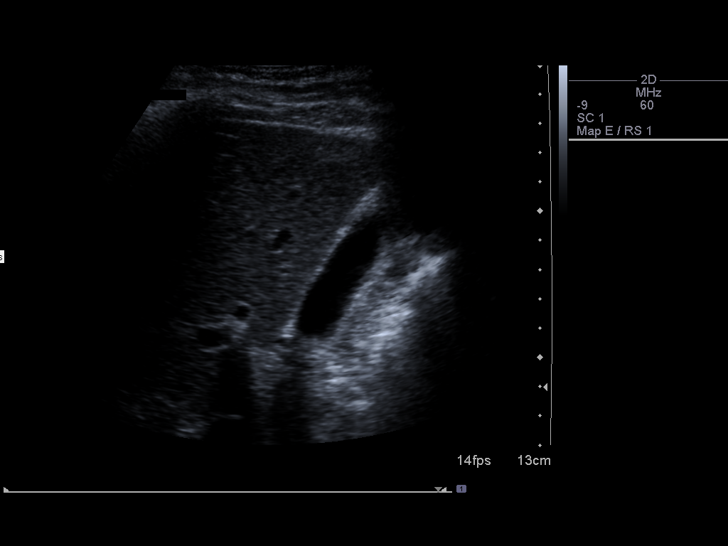
[im 46/62]
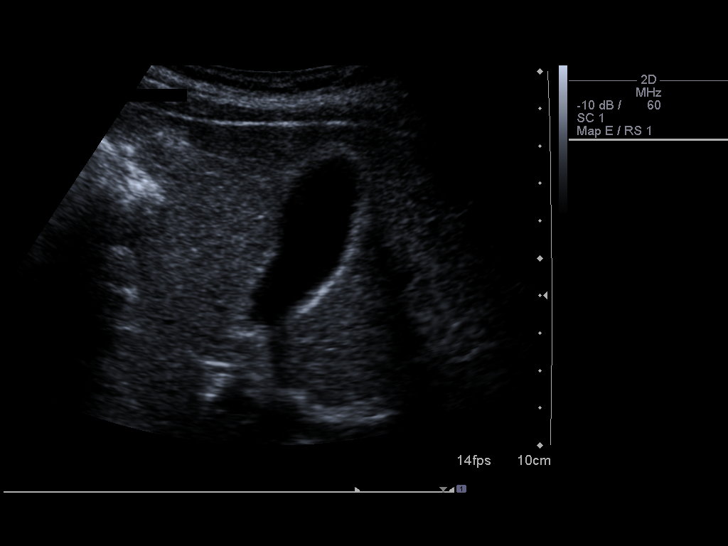
[im 51/62]
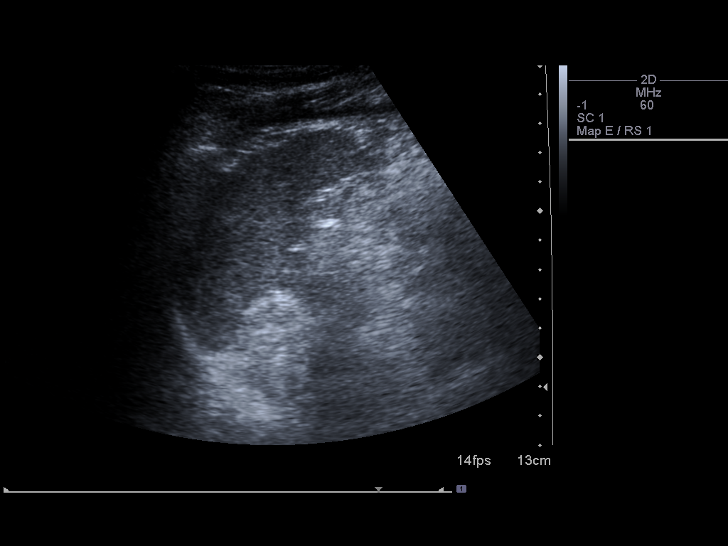
[im 56/62]
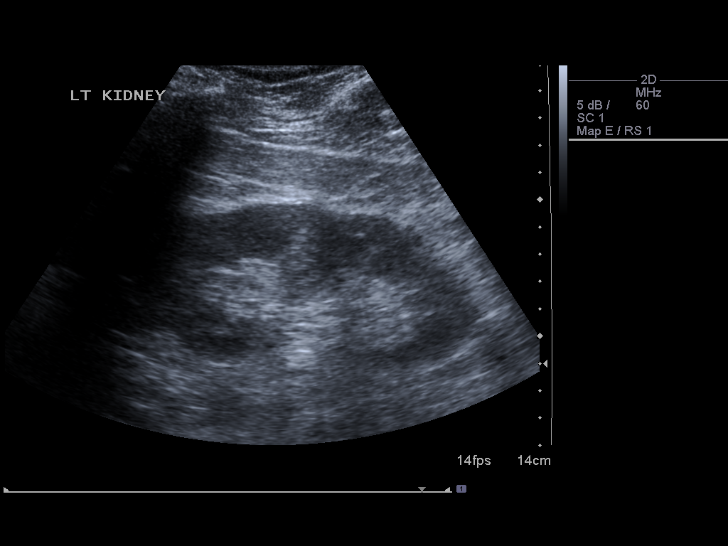
[im 62/62]
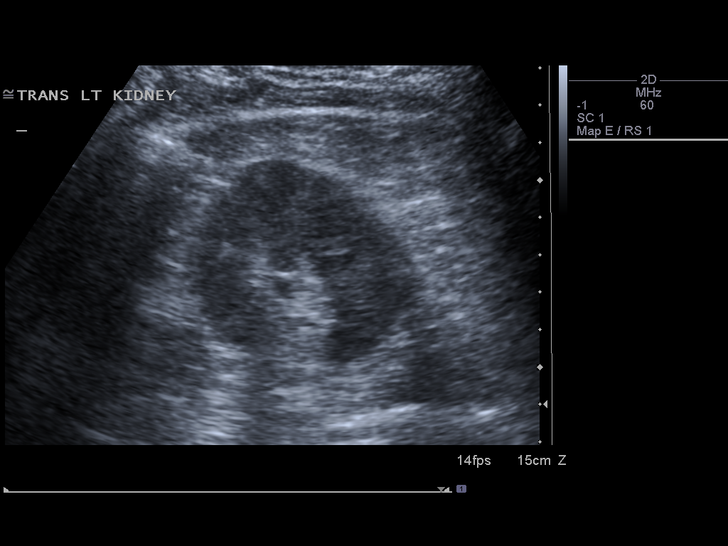

[14 of 25 positions shown; findings below may reference images not displayed]

FINDINGS: Gallbladder:  normal, without stone, wall thickening, or
      pericholecystic fluid.  Sonographic Murphy's sign was not
      elicited.

      Common bile duct:  normal, at 2 mm

      Liver:  normal in echogenicity without focal lesion.

      IVC:  within normal limits.

      Pancreas: Pancreatic tail suboptimally visualized..

      Spleen:  normal in size and echotexture.

      Right Kidney:  10.8 cm.  No hydronephrosis.

      Left Kidney:  12.1 cm.  No hydronephrosis.

Abdominal aorta:  Non aneurysmal without ascites.
IMPRESSION: No acute process or explanation for abdominal pain.

## 2014-02-18 ENCOUNTER — Ambulatory Visit: Payer: 59 | Admitting: Internal Medicine

## 2014-02-19 ENCOUNTER — Other Ambulatory Visit: Payer: Self-pay | Admitting: Internal Medicine

## 2014-03-16 ENCOUNTER — Telehealth: Payer: Self-pay

## 2014-03-16 DIAGNOSIS — IMO0001 Reserved for inherently not codable concepts without codable children: Secondary | ICD-10-CM

## 2014-03-16 DIAGNOSIS — E1165 Type 2 diabetes mellitus with hyperglycemia: Principal | ICD-10-CM

## 2014-03-16 NOTE — Telephone Encounter (Signed)
Diabetic bundle-lipid, bmet and a1c ordered Called and left VM for pt TCB and schedule f/u appointment concerning diabetes and cholesterol

## 2014-03-19 ENCOUNTER — Other Ambulatory Visit: Payer: Self-pay | Admitting: Gastroenterology

## 2014-05-26 ENCOUNTER — Ambulatory Visit: Payer: 59 | Admitting: Internal Medicine

## 2014-05-29 ENCOUNTER — Other Ambulatory Visit: Payer: Self-pay | Admitting: Internal Medicine

## 2014-06-04 ENCOUNTER — Other Ambulatory Visit (INDEPENDENT_AMBULATORY_CARE_PROVIDER_SITE_OTHER): Payer: 59

## 2014-06-04 ENCOUNTER — Encounter: Payer: Self-pay | Admitting: Internal Medicine

## 2014-06-04 ENCOUNTER — Ambulatory Visit (INDEPENDENT_AMBULATORY_CARE_PROVIDER_SITE_OTHER): Payer: 59 | Admitting: Internal Medicine

## 2014-06-04 VITALS — BP 120/80 | HR 77 | Temp 98.4°F | Resp 13 | Wt 241.2 lb

## 2014-06-04 DIAGNOSIS — Z23 Encounter for immunization: Secondary | ICD-10-CM

## 2014-06-04 DIAGNOSIS — E7800 Pure hypercholesterolemia, unspecified: Secondary | ICD-10-CM

## 2014-06-04 DIAGNOSIS — E1149 Type 2 diabetes mellitus with other diabetic neurological complication: Secondary | ICD-10-CM

## 2014-06-04 DIAGNOSIS — E1165 Type 2 diabetes mellitus with hyperglycemia: Principal | ICD-10-CM

## 2014-06-04 DIAGNOSIS — I10 Essential (primary) hypertension: Secondary | ICD-10-CM

## 2014-06-04 DIAGNOSIS — IMO0001 Reserved for inherently not codable concepts without codable children: Secondary | ICD-10-CM

## 2014-06-04 LAB — BASIC METABOLIC PANEL
BUN: 18 mg/dL (ref 6–23)
CALCIUM: 9.9 mg/dL (ref 8.4–10.5)
CHLORIDE: 97 meq/L (ref 96–112)
CO2: 28 meq/L (ref 19–32)
Creatinine, Ser: 1.3 mg/dL (ref 0.4–1.5)
GFR: 75.87 mL/min (ref >60.00)
GLUCOSE: 376 mg/dL — AB (ref 70–99)
Potassium: 4.9 mEq/L (ref 3.5–5.1)
Sodium: 130 mEq/L — ABNORMAL LOW (ref 135–145)

## 2014-06-04 LAB — HEPATIC FUNCTION PANEL
ALK PHOS: 69 U/L (ref 39–117)
ALT: 34 U/L (ref 0–53)
AST: 25 U/L (ref 0–37)
Albumin: 4.5 g/dL (ref 3.5–5.2)
BILIRUBIN TOTAL: 1.1 mg/dL (ref 0.2–1.2)
Bilirubin, Direct: 0.1 mg/dL (ref 0.0–0.3)
Total Protein: 8.3 g/dL (ref 6.0–8.3)

## 2014-06-04 LAB — LIPID PANEL
Cholesterol: 193 mg/dL (ref 0–200)
HDL: 39.2 mg/dL (ref >39.00)
LDL CALC: 130 mg/dL — AB (ref 0–99)
NonHDL: 153.8
Total CHOL/HDL Ratio: 5
Triglycerides: 120 mg/dL (ref 0.0–149.0)
VLDL: 24 mg/dL (ref 0.0–40.0)

## 2014-06-04 LAB — MICROALBUMIN / CREATININE URINE RATIO
Creatinine,U: 111.6 mg/dL
MICROALB/CREAT RATIO: 5.7 mg/g (ref 0.0–30.0)
Microalb, Ur: 6.4 mg/dL — ABNORMAL HIGH (ref 0.0–1.9)

## 2014-06-04 LAB — TSH: TSH: 1.84 u[IU]/mL (ref 0.35–4.50)

## 2014-06-04 LAB — HEMOGLOBIN A1C: HEMOGLOBIN A1C: 11.7 % — AB (ref 4.6–6.5)

## 2014-06-04 LAB — CK: CK TOTAL: 416 U/L — AB (ref 7–232)

## 2014-06-04 NOTE — Assessment & Plan Note (Signed)
A1c , urine microalbumin, BMET 

## 2014-06-04 NOTE — Progress Notes (Signed)
Pre visit review using our clinic review tool, if applicable. No additional management support is needed unless otherwise documented below in the visit note. 

## 2014-06-04 NOTE — Progress Notes (Signed)
   Subjective:    Patient ID: William Barber, male    DOB: 1960-09-30, 53 y.o.   MRN: [id number]  HPI  He is here for followup of his diabetes, dyslipidemia, and hypertension  He has been compliant with his medicines.  He does not check his blood pressures at home.  He is on modified heart healthy, low-salt, variable carb intake diet  He walks 4-5 miles per day on his job 5 days a week.  He takes Humalog 5-10 units before breakfast and evening meal. At breakfast glucoses are 160-180. At lunch < 100. In the evening glucoses are 140 to 150s. He is also on Levemir 20 units at bedtime. A1c was 11.4% on 08/20/13. On 04/29/13 it was 6.2%.  He does describe ED. He is a nonsmoker.  He has cramps in his legs and also hands.  Blurred vision is a chronic issue. His ophthalmologic exam is overdue; he has not had a podiatry evaluation  He does describe polydipsia in the context of profuse sweating.  He's had dry mouth since he was on chemotherapy.    Review of Systems  All below NEGATIVE: Chest pain, palpitations       Dyspnea Edema Claudication  Lightheadedness,Syncope Weight change Polyuria/phagia Diplopia/lossof vision Limb numbness/tingling/burning Non healing skin lesions Abd pain, bowel changes        Objective:   Physical Exam   Pertinent or positive findings include: He has a very faint mustache and wears a goatee. Some lid lag suggested. Pes planus is present. There is some decreased sensation over the soles to light touch. Deep tendon reflexes are 0+ @ the knees.  General appearance :adequately nourished; in no distress. Eyes: No conjunctival inflammation or scleral icterus is present. Oral exam: Dental hygiene is good. Lips and gums are healthy appearing.There is no oropharyngeal erythema or exudate noted.  Heart:  Normal rate and regular rhythm. S1 and S2 normal without gallop, murmur, click, rub or other extra sounds   Lungs:Chest clear to auscultation; no  wheezes, rhonchi,rales ,or rubs present.No increased work of breathing.  Abdomen: bowel sounds normal, soft and non-tender without masses, organomegaly or hernias noted.  No guarding or rebound. No flank tenderness to percussion. Skin:Warm & dry.  Intact without suspicious lesions or rashes ; no jaundice or tenting Lymphatic: No lymphadenopathy is noted about the head, neck, axilla          Assessment & Plan:  See Current Assessment & Plan in Problem List under specific Diagnosis

## 2014-06-04 NOTE — Assessment & Plan Note (Signed)
Blood pressure goals reviewed. BMET 

## 2014-06-04 NOTE — Patient Instructions (Signed)
Your next office appointment will be determined based upon review of your pending labs . Those instructions will be transmitted to you through My Chart  OR  by mail;whichever process is your choice to receive results & recommendations . 

## 2014-06-04 NOTE — Assessment & Plan Note (Signed)
Lipids, LFTs, TSH ,CK 

## 2014-06-06 ENCOUNTER — Other Ambulatory Visit: Payer: Self-pay | Admitting: Internal Medicine

## 2014-06-06 DIAGNOSIS — R748 Abnormal levels of other serum enzymes: Secondary | ICD-10-CM

## 2014-06-06 DIAGNOSIS — E785 Hyperlipidemia, unspecified: Secondary | ICD-10-CM

## 2014-07-05 ENCOUNTER — Other Ambulatory Visit: Payer: Self-pay | Admitting: Internal Medicine

## 2014-07-30 ENCOUNTER — Ambulatory Visit (INDEPENDENT_AMBULATORY_CARE_PROVIDER_SITE_OTHER): Payer: 59 | Admitting: Internal Medicine

## 2014-07-30 ENCOUNTER — Encounter: Payer: Self-pay | Admitting: Internal Medicine

## 2014-07-30 VITALS — BP 122/72 | HR 77 | Temp 98.4°F | Resp 10 | Wt 248.5 lb

## 2014-07-30 DIAGNOSIS — IMO0002 Reserved for concepts with insufficient information to code with codable children: Secondary | ICD-10-CM

## 2014-07-30 DIAGNOSIS — S91002A Unspecified open wound, left ankle, initial encounter: Secondary | ICD-10-CM

## 2014-07-30 DIAGNOSIS — R748 Abnormal levels of other serum enzymes: Secondary | ICD-10-CM

## 2014-07-30 DIAGNOSIS — E1165 Type 2 diabetes mellitus with hyperglycemia: Secondary | ICD-10-CM

## 2014-07-30 NOTE — Progress Notes (Signed)
   Subjective:    Patient ID: William Barber, male    DOB: 08/29/1961, 53 y.o.   MRN: [id number]  HPI  He sustained an injury to the left medial ankle area  07/23/14. Apparently his ankle was struck by a lift machine.  He has experienced dull aching in this area along with some redness and swelling as of 11/19. He believes these changes have improved as of today  He's been treating the associated abrasion with hydrogen peroxide and topical Neosporin  He is diabetic, insulin-dependent. His A1c was 11.7% in September of this year.   An A1c of 11.7% correlates with an average sugar of 348 and >135% increased  CV risk.  He takes Humalog 10-15 units before meals and Levemir 20 units in the evening.  Additionally his  last labs showed a CK of 416. He is on lovastatin. He has not stopped that as instructed. He continues to have cramping pain. He is very physically active as a Dealer.     Review of Systems   He denies fever, chills, or sweats  There has been no purulent drainage from the lesion.       Objective:   Physical Exam   Pertinent positive findings include a 4.5 x 4.5 cm area of hyperpigmentation without associated increased temperature or tenderness at the left medial ankle area. There is a central eschar which is well-healed. There is no sign of active cellulitis or purulence He does have pes planus. Pedal pulses are equal. Posterior tibial pulses are slightly decreased. Nail health is good Light touch is intact. Deep tendon reflexes, tone, strength normal  General appearance :well nourished; in no distress. Eyes: No conjunctival inflammation or scleral icterus is present. Heart:  Normal rate and regular rhythm. S1 and S2 normal without gallop, murmur, click, rub or other extra sounds   Lungs:Chest clear to auscultation; no wheezes, rhonchi,rales ,or rubs present.No increased work of breathing.  Vascular : all pulses equal ; no bruits present. Skin:Warm & dry.  Intact  without suspicious lesions or rashes ; no tenting Lymphatic: No lymphadenopathy is noted about the head, neck, axilla              Assessment & Plan:  #1 wound left medial ankle without signs of cellulitis or purulence  #2 profoundly uncontrolled diabetes without complications to date  #3 elevated CK possibly from Lovastatin   Plan: He should monitor the lesion without definitive treatment. It appears to be healing well without signs of complication or active infection  He will be referred to Endocrinology.  Lovastatin will be held and the CK rechecked in 4 weeks. He has to determine what agents or options on his plan in place of lovastatin.

## 2014-07-30 NOTE — Progress Notes (Signed)
Pre visit review using our clinic review tool, if applicable. No additional management support is needed unless otherwise documented below in the visit note. 

## 2014-07-30 NOTE — Patient Instructions (Signed)
  The following nutritional changes may help prevent Diabetes progression & complications.  White carbohydrates (potatoes, rice, bread, and pasta) cause a high spike of the sugar level which stays elevated for a significant period of time (called sugar"load").  For example a  baked potato has a cup of sugar and a  french fry  2 teaspoons of sugar.  More complex carbs such as yams, wild  rice, whole grained bread &  wheat pasta have been much lower spike and persistent load of sugar than the white carbs.  The Endocrinology referral will be scheduled and you'll be notified of the time.Please call the Referral Co-Ordinator @ [phone] if you have not been notified of appointment time within 7-10 days.  Stop the lovastatin and recheck the CK after 4 weeks.

## 2014-08-11 ENCOUNTER — Encounter: Payer: Self-pay | Admitting: Endocrinology

## 2014-08-11 ENCOUNTER — Ambulatory Visit (INDEPENDENT_AMBULATORY_CARE_PROVIDER_SITE_OTHER): Payer: 59 | Admitting: Endocrinology

## 2014-08-11 VITALS — BP 136/98 | HR 78 | Temp 98.9°F | Ht 72.0 in | Wt 249.0 lb

## 2014-08-11 DIAGNOSIS — E1142 Type 2 diabetes mellitus with diabetic polyneuropathy: Secondary | ICD-10-CM

## 2014-08-11 LAB — HEMOGLOBIN A1C: Hgb A1c MFr Bld: 9.5 % — ABNORMAL HIGH (ref 4.6–6.5)
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
## 2014-08-11 NOTE — Patient Instructions (Addendum)
good diet and exercise habits significanly improve the control of your diabetes.  please let me know if you wish to be referred to a dietician.  high blood sugar is very risky to your health.  you should see an eye doctor and dentist every year.  It is very important to get all recommended vaccinations.  controlling your blood pressure and cholesterol drastically reduces the damage diabetes does to your body.  Those who smoke should quit.  please discuss these with your doctor.  check your blood sugar twice a day.  vary the time of day when you check, between before the 3 meals, and at bedtime.  also check if you have symptoms of your blood sugar being too high or too low.  please keep a record of the readings and bring it to your next appointment here.  You can write it on any piece of paper.  please call us sooner if your blood sugar goes below 70, or if you have a lot of readings over 200. Keep the ulcerated area on you left leg covered with antibiotic ointment and a large bandaid.   blood tests are being requested for you today.  We'll let you know about the results. Based on the results, we'll plan to reduce the levemir, and increase the humalog (however, you should take fewer units on days when you are active).   You can stop taking the metformin.  Please come back for a follow-up appointment in 6 weeks.

## 2014-08-13 ENCOUNTER — Other Ambulatory Visit: Payer: Self-pay | Admitting: Internal Medicine

## 2014-09-24 ENCOUNTER — Ambulatory Visit (INDEPENDENT_AMBULATORY_CARE_PROVIDER_SITE_OTHER): Payer: Managed Care, Other (non HMO) | Admitting: Endocrinology

## 2014-09-24 ENCOUNTER — Encounter: Payer: Self-pay | Admitting: Endocrinology

## 2014-09-24 VITALS — BP 124/70 | HR 79 | Temp 97.9°F | Ht 72.0 in | Wt 250.0 lb

## 2014-09-24 DIAGNOSIS — E1142 Type 2 diabetes mellitus with diabetic polyneuropathy: Secondary | ICD-10-CM

## 2014-09-24 NOTE — Patient Instructions (Addendum)
check your blood sugar twice a day.  vary the time of day when you check, between before the 3 meals, and at bedtime.  also check if you have symptoms of your blood sugar being too high or too low.  please keep a record of the readings and bring it to your next appointment here.  You can write it on any piece of paper.  please call us sooner if your blood sugar goes below 70, or if you have a lot of readings over 200.   Please come back for a follow-up appointment in 2 months.
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
## 2014-10-07 ENCOUNTER — Other Ambulatory Visit: Payer: Self-pay | Admitting: Internal Medicine

## 2014-11-12 LAB — HM DIABETES EYE EXAM

## 2014-11-23 ENCOUNTER — Ambulatory Visit (INDEPENDENT_AMBULATORY_CARE_PROVIDER_SITE_OTHER): Payer: Managed Care, Other (non HMO) | Admitting: Endocrinology

## 2014-11-23 ENCOUNTER — Encounter: Payer: Self-pay | Admitting: Endocrinology

## 2014-11-23 VITALS — BP 132/92 | HR 82 | Temp 98.2°F | Ht 72.0 in | Wt 249.0 lb

## 2014-11-23 DIAGNOSIS — E1142 Type 2 diabetes mellitus with diabetic polyneuropathy: Secondary | ICD-10-CM

## 2014-11-23 MED ORDER — INSULIN ASPART PROT & ASPART (70-30 MIX) 100 UNIT/ML PEN: PEN_INJECTOR | SUBCUTANEOUS | Status: DC

## 2014-11-23 NOTE — Patient Instructions (Addendum)
check your blood sugar twice a day.  vary the time of day when you check, between before the 3 meals, and at bedtime.  also check if you have symptoms of your blood sugar being too high or too low.  please keep a record of the readings and bring it to your next appointment here.  You can write it on any piece of paper.  please call us sooner if your blood sugar goes below 70, or if you have a lot of readings over 200.   Please come back for a follow-up appointment in 1 month.   Please change both current insulins to novolog, 50 units with breakfast and 20 units with the evening meal.
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
## 2014-11-24 ENCOUNTER — Encounter: Payer: Self-pay | Admitting: Internal Medicine

## 2014-11-29 ENCOUNTER — Telehealth: Payer: Self-pay | Admitting: Endocrinology

## 2014-11-29 MED ORDER — INSULIN ASPART PROT & ASPART (70-30 MIX) 100 UNIT/ML ~~LOC~~ SUSP: SUBCUTANEOUS | Status: DC

## 2014-11-29 NOTE — Telephone Encounter (Signed)
Rx sent to pharmacy per pt's request.  

## 2014-11-29 NOTE — Telephone Encounter (Signed)
Patient called stating that his insurance will not cover Novolog Pens   Please call in vials to his pharmacy   Pharmacy: [facility]   Thank you

## 2014-12-21 ENCOUNTER — Telehealth: Payer: Self-pay | Admitting: Endocrinology

## 2014-12-21 MED ORDER — "INSULIN SYRINGE 30G X 5/16"" 1 ML MISC": Status: DC

## 2014-12-21 NOTE — Telephone Encounter (Signed)
Rx sent per pt's request.  

## 2014-12-21 NOTE — Telephone Encounter (Signed)
Patient need refill of insulin needles for Novalog, [facility]

## 2014-12-24 ENCOUNTER — Ambulatory Visit (INDEPENDENT_AMBULATORY_CARE_PROVIDER_SITE_OTHER): Payer: Managed Care, Other (non HMO) | Admitting: Endocrinology

## 2014-12-24 ENCOUNTER — Encounter: Payer: Self-pay | Admitting: Endocrinology

## 2014-12-24 VITALS — BP 132/88 | HR 70 | Temp 98.5°F | Ht 72.0 in | Wt 256.0 lb

## 2014-12-24 DIAGNOSIS — E1142 Type 2 diabetes mellitus with diabetic polyneuropathy: Secondary | ICD-10-CM
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
## 2014-12-24 NOTE — Patient Instructions (Signed)
Please change the insulin to 40 units with breakfast (50 if you are not working or going to be active at home), and 30 units with the evening meal.   check your blood sugar twice a day.  vary the time of day when you check, between before the 3 meals, and at bedtime.  also check if you have symptoms of your blood sugar being too high or too low.  please keep a record of the readings and bring it to your next appointment here.  You can write it on any piece of paper.  please call us sooner if your blood sugar goes below 70, or if you have a lot of readings over 200. Please come back for a follow-up appointment in 6 weeks.

## 2015-01-31 ENCOUNTER — Other Ambulatory Visit: Payer: Self-pay

## 2015-01-31 MED ORDER — INSULIN ASPART PROT & ASPART (70-30 MIX) 100 UNIT/ML ~~LOC~~ SUSP: SUBCUTANEOUS | Status: DC

## 2015-02-04 ENCOUNTER — Encounter: Payer: Self-pay | Admitting: Endocrinology

## 2015-02-04 ENCOUNTER — Ambulatory Visit (INDEPENDENT_AMBULATORY_CARE_PROVIDER_SITE_OTHER): Payer: Managed Care, Other (non HMO) | Admitting: Endocrinology

## 2015-02-04 VITALS — BP 138/80 | HR 68 | Temp 98.8°F | Ht 72.0 in | Wt 261.0 lb

## 2015-02-04 DIAGNOSIS — E1142 Type 2 diabetes mellitus with diabetic polyneuropathy: Secondary | ICD-10-CM

## 2015-02-04 LAB — HEMOGLOBIN A1C: Hgb A1c MFr Bld: 6.8 % — ABNORMAL HIGH (ref 4.6–6.5)

## 2015-02-04 NOTE — Patient Instructions (Addendum)
Please continue the insulin to 40 units with breakfast (50 if you are not working or going to be active at home), and 30 units with the evening meal.   check your blood sugar twice a day.  vary the time of day when you check, between before the 3 meals, and at bedtime.  also check if you have symptoms of your blood sugar being too high or too low.  please keep a record of the readings and bring it to your next appointment here.  You can write it on any piece of paper.  please call us sooner if your blood sugar goes below 70, or if you have a lot of readings over 200. Please come back for a follow-up appointment in 3 months.
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
## 2015-05-23 ENCOUNTER — Ambulatory Visit (INDEPENDENT_AMBULATORY_CARE_PROVIDER_SITE_OTHER): Payer: Managed Care, Other (non HMO) | Admitting: Endocrinology

## 2015-05-23 ENCOUNTER — Encounter: Payer: Self-pay | Admitting: Endocrinology

## 2015-05-23 VITALS — BP 136/86 | HR 68 | Temp 98.2°F | Ht 72.0 in | Wt 257.0 lb

## 2015-05-23 DIAGNOSIS — E1142 Type 2 diabetes mellitus with diabetic polyneuropathy: Secondary | ICD-10-CM

## 2015-05-23 LAB — POCT GLYCOSYLATED HEMOGLOBIN (HGB A1C): Hemoglobin A1C: 9.4

## 2015-05-23 MED ORDER — INSULIN ASPART PROT & ASPART (70-30 MIX) 100 UNIT/ML ~~LOC~~ SUSP: SUBCUTANEOUS | Status: DC
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
## 2015-05-23 NOTE — Patient Instructions (Addendum)
Please continue the insulin to 40 units with breakfast (50 if you are not working or going to be active at home), and 40 units with the evening meal.   check your blood sugar twice a day.  vary the time of day when you check, between before the 3 meals, and at bedtime.  also check if you have symptoms of your blood sugar being too high or too low.  please keep a record of the readings and bring it to your next appointment here.  You can write it on any piece of paper.  please call us sooner if your blood sugar goes below 70, or if you have a lot of readings over 200.  Please come back for a follow-up appointment in 3 months.

## 2015-08-22 ENCOUNTER — Ambulatory Visit (INDEPENDENT_AMBULATORY_CARE_PROVIDER_SITE_OTHER): Payer: Managed Care, Other (non HMO) | Admitting: Endocrinology

## 2015-08-22 ENCOUNTER — Encounter: Payer: Self-pay | Admitting: Endocrinology

## 2015-08-22 VITALS — BP 137/94 | HR 72 | Temp 98.6°F | Ht 72.0 in | Wt 256.0 lb

## 2015-08-22 DIAGNOSIS — E1142 Type 2 diabetes mellitus with diabetic polyneuropathy: Secondary | ICD-10-CM | POA: Diagnosis not present

## 2015-08-22 LAB — POCT GLYCOSYLATED HEMOGLOBIN (HGB A1C): Hemoglobin A1C: 9.8

## 2015-08-22 MED ORDER — INSULIN GLARGINE 100 UNIT/ML ~~LOC~~ SOLN: 80.0000 [IU] | SUBCUTANEOUS | Status: DC

## 2015-08-22 NOTE — Patient Instructions (Addendum)
When you use up your current insulin, please change to lantus, 80 units each morning.  Here is a prescription. check your blood sugar twice a day.  vary the time of day when you check, between before the 3 meals, and at bedtime.  also check if you have symptoms of your blood sugar being too high or too low.  please keep a record of the readings and bring it to your next appointment here.  You can write it on any piece of paper.  please call us sooner if your blood sugar goes below 70, or if you have a lot of readings over 200.  Please come back for a follow-up appointment in 3 months.
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
## 2015-10-07 ENCOUNTER — Other Ambulatory Visit: Payer: Self-pay | Admitting: Internal Medicine

## 2015-10-20 ENCOUNTER — Telehealth: Payer: Self-pay | Admitting: Endocrinology

## 2015-10-20 NOTE — Telephone Encounter (Signed)
Patient ask if you had a discount card for Lantus, please advise

## 2015-10-20 NOTE — Telephone Encounter (Signed)
I contacted the pt. I advised the pt and this time we do not have any Lantus discount cards. Pt was advised to print a discount card online from http://www.[employer].net/.  Pt voiced understanding.

## 2015-11-20 NOTE — Patient Instructions (Addendum)
Please increase the lantus to 110 units each morning.  check your blood sugar twice a day.  vary the time of day when you check, between before the 3 meals, and at bedtime.  also check if you have symptoms of your blood sugar being too high or too low.  please keep a record of the readings and bring it to your next appointment here.  You can write it on any piece of paper.  please call us sooner if your blood sugar goes below 70, or if you have a lot of readings over 200.  Please come back for a follow-up appointment in 2 months.
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
## 2015-11-21 ENCOUNTER — Ambulatory Visit (INDEPENDENT_AMBULATORY_CARE_PROVIDER_SITE_OTHER): Payer: Managed Care, Other (non HMO) | Admitting: Endocrinology

## 2015-11-21 ENCOUNTER — Other Ambulatory Visit (INDEPENDENT_AMBULATORY_CARE_PROVIDER_SITE_OTHER): Payer: Managed Care, Other (non HMO) | Admitting: *Deleted

## 2015-11-21 ENCOUNTER — Encounter: Payer: Self-pay | Admitting: Endocrinology

## 2015-11-21 VITALS — BP 122/80 | HR 74 | Temp 98.2°F | Resp 12 | Wt 260.0 lb

## 2015-11-21 DIAGNOSIS — Z794 Long term (current) use of insulin: Secondary | ICD-10-CM | POA: Diagnosis not present

## 2015-11-21 DIAGNOSIS — E1142 Type 2 diabetes mellitus with diabetic polyneuropathy: Secondary | ICD-10-CM

## 2015-11-21 LAB — POCT GLYCOSYLATED HEMOGLOBIN (HGB A1C): Hemoglobin A1C: 9.1

## 2015-11-21 MED ORDER — INSULIN GLARGINE 100 UNIT/ML ~~LOC~~ SOLN: 110.0000 [IU] | SUBCUTANEOUS | Status: DC

## 2015-12-08 ENCOUNTER — Encounter (INDEPENDENT_AMBULATORY_CARE_PROVIDER_SITE_OTHER): Payer: Self-pay

## 2015-12-09 ENCOUNTER — Telehealth: Payer: Self-pay

## 2015-12-09 ENCOUNTER — Other Ambulatory Visit: Payer: Self-pay | Admitting: Internal Medicine

## 2015-12-09 NOTE — Telephone Encounter (Signed)
LVM for pt to call back as soon as possible.   RE: Flu Vaccine for this flu season.   NEW PCP?

## 2015-12-12 NOTE — Telephone Encounter (Signed)
Left message advising patient to call back to get established/make appt with new pcp---let tamara know when appt is made so that refill for bp med can be called to pharm

## 2015-12-14 ENCOUNTER — Telehealth: Payer: Self-pay | Admitting: Endocrinology

## 2015-12-14 NOTE — Telephone Encounter (Signed)
Pt notified of note below and voiced understanding.

## 2015-12-14 NOTE — Telephone Encounter (Signed)
See note below and please advise if ok to refill this med. We have not refilled this med before.

## 2015-12-14 NOTE — Telephone Encounter (Signed)
Pt needs refill on enalipril called [facility]

## 2015-12-14 NOTE — Telephone Encounter (Signed)
Please direct request to PCP

## 2015-12-20 ENCOUNTER — Other Ambulatory Visit: Payer: Self-pay

## 2015-12-20 MED ORDER — ENALAPRIL MALEATE 20 MG PO TABS: ORAL_TABLET | ORAL | Status: DC

## 2015-12-20 NOTE — Telephone Encounter (Signed)
Patient called back in. Made an appt to establish with [facility] in June. [facility] [address]

## 2015-12-20 NOTE — Telephone Encounter (Signed)
vasotec rx sent to [facility]

## 2016-01-20 ENCOUNTER — Ambulatory Visit (INDEPENDENT_AMBULATORY_CARE_PROVIDER_SITE_OTHER): Payer: Managed Care, Other (non HMO) | Admitting: Endocrinology

## 2016-01-20 ENCOUNTER — Encounter: Payer: Self-pay | Admitting: Endocrinology

## 2016-01-20 VITALS — BP 144/86 | HR 75 | Temp 98.5°F | Ht 73.0 in | Wt 259.0 lb

## 2016-01-20 DIAGNOSIS — E119 Type 2 diabetes mellitus without complications: Secondary | ICD-10-CM | POA: Diagnosis not present

## 2016-01-20 DIAGNOSIS — Z794 Long term (current) use of insulin: Secondary | ICD-10-CM

## 2016-01-20 LAB — MICROALBUMIN / CREATININE URINE RATIO
CREATININE, U: 152.2 mg/dL
MICROALB UR: 0.8 mg/dL (ref 0.0–1.9)
MICROALB/CREAT RATIO: 0.5 mg/g (ref 0.0–30.0)
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
## 2016-01-20 NOTE — Patient Instructions (Addendum)
Please continue the same insulin. A diabetes urine test is requested for you today.  We'll let you know about the result.  check your blood sugar twice a day.  vary the time of day when you check, between before the 3 meals, and at bedtime.  also check if you have symptoms of your blood sugar being too high or too low.  please keep a record of the readings and bring it to your next appointment here.  You can write it on any piece of paper.  please call us sooner if your blood sugar goes below 70, or if you have a lot of readings over 200.  Please come back for a follow-up appointment in 3 months.

## 2016-02-21 ENCOUNTER — Ambulatory Visit: Payer: Managed Care, Other (non HMO) | Admitting: Internal Medicine

## 2016-02-21 DIAGNOSIS — Z0289 Encounter for other administrative examinations: Secondary | ICD-10-CM

## 2016-03-14 ENCOUNTER — Other Ambulatory Visit: Payer: Self-pay | Admitting: Endocrinology

## 2016-03-16 ENCOUNTER — Other Ambulatory Visit: Payer: Self-pay

## 2016-03-16 MED ORDER — "INSULIN SYRINGE-NEEDLE U-100 31G X 5/16"" 1 ML MISC": Status: DC

## 2016-04-05 ENCOUNTER — Other Ambulatory Visit: Payer: Self-pay | Admitting: Internal Medicine

## 2016-04-09 NOTE — Telephone Encounter (Signed)
Left message advising patient to call back to [place] appt/get estab with new pcp---appt can be at earliest convenience---let tamara know when appt is made so that enalapril rx refill can be sent to [facility]

## 2016-04-19 ENCOUNTER — Ambulatory Visit (INDEPENDENT_AMBULATORY_CARE_PROVIDER_SITE_OTHER): Payer: Managed Care, Other (non HMO) | Admitting: Family

## 2016-04-19 ENCOUNTER — Encounter: Payer: Self-pay | Admitting: Family

## 2016-04-19 VITALS — BP 132/88 | HR 65 | Temp 98.2°F | Resp 16 | Ht 73.0 in | Wt 266.0 lb

## 2016-04-19 DIAGNOSIS — Z23 Encounter for immunization: Secondary | ICD-10-CM | POA: Diagnosis not present

## 2016-04-19 DIAGNOSIS — Z0000 Encounter for general adult medical examination without abnormal findings: Secondary | ICD-10-CM | POA: Diagnosis not present

## 2016-04-19 DIAGNOSIS — Z0001 Encounter for general adult medical examination with abnormal findings: Secondary | ICD-10-CM | POA: Insufficient documentation

## 2016-04-19 NOTE — Patient Instructions (Signed)
Thank you for choosing [facility].  Summary/Instructions:  Please stop by the lab on the lower level of the building for your blood work. Your results will be released to MyChart (or called to you) after review, usually within 72 hours after test completion. If any changes need to be made, you will be notified at that same time.  1. The lab is open from 7:30am to 5:30 pm Monday-Friday  2. No appointment is necessary  3. Fasting (if needed) is 6-8 hours after food and drink; black coffee  and water are okay   Health Maintenance, Male A healthy lifestyle and preventative care can promote health and wellness.  Maintain regular health, dental, and eye exams.  Eat a healthy diet. Foods like vegetables, fruits, whole grains, low-fat dairy products, and lean protein foods contain the nutrients you need and are low in calories. Decrease your intake of foods high in solid fats, added sugars, and salt. Get information about a proper diet from your health care provider, if necessary.  Regular physical exercise is one of the most important things you can do for your health. Most adults should get at least 150 minutes of moderate-intensity exercise (any activity that increases your heart rate and causes you to sweat) each week. In addition, most adults need muscle-strengthening exercises on 2 or more days a week.   Maintain a healthy weight. The body mass index (BMI) is a screening tool to identify possible weight problems. It provides an estimate of body fat based on height and weight. Your health care provider can find your BMI and can help you achieve or maintain a healthy weight. For males 20 years and older:  A BMI below 18.5 is considered underweight.  A BMI of 18.5 to 24.9 is normal.  A BMI of 25 to 29.9 is considered overweight.  A BMI of 30 and above is considered obese.  Maintain normal blood lipids and cholesterol by exercising and minimizing your intake of saturated fat. Eat a  balanced diet with plenty of fruits and vegetables. Blood tests for lipids and cholesterol should begin at age 20 and be repeated every 5 years. If your lipid or cholesterol levels are high, you are over age 50, or you are at high risk for heart disease, you may need your cholesterol levels checked more frequently.Ongoing high lipid and cholesterol levels should be treated with medicines if diet and exercise are not working.  If you smoke, find out from your health care provider how to quit. If you do not use tobacco, do not start.  Lung cancer screening is recommended for adults aged 55-80 years who are at high risk for developing lung cancer because of a history of smoking. A yearly low-dose CT scan of the lungs is recommended for people who have at least a 30-pack-year history of smoking and are current smokers or have quit within the past 15 years. A pack year of smoking is smoking an average of 1 pack of cigarettes a day for 1 year (for example, a 30-pack-year history of smoking could mean smoking 1 pack a day for 30 years or 2 packs a day for 15 years). Yearly screening should continue until the smoker has stopped smoking for at least 15 years. Yearly screening should be stopped for people who develop a health problem that would prevent them from having lung cancer treatment.  If you choose to drink alcohol, do not have more than 2 drinks per day. One drink is considered to be   12 oz (360 mL) of beer, 5 oz (150 mL) of wine, or 1.5 oz (45 mL) of liquor.  Avoid the use of street drugs. Do not share needles with anyone. Ask for help if you need support or instructions about stopping the use of drugs.  High blood pressure causes heart disease and increases the risk of stroke. High blood pressure is more likely to develop in:  People who have blood pressure in the end of the normal range (100-139/85-89 mm Hg).  People who are overweight or obese.  People who are African American.  If you are 18-39  years of age, have your blood pressure checked every 3-5 years. If you are 40 years of age or older, have your blood pressure checked every year. You should have your blood pressure measured twice--once when you are at a hospital or clinic, and once when you are not at a hospital or clinic. Record the average of the two measurements. To check your blood pressure when you are not at a hospital or clinic, you can use:  An automated blood pressure machine at a pharmacy.  A home blood pressure monitor.  If you are 45-79 years old, ask your health care provider if you should take aspirin to prevent heart disease.  Diabetes screening involves taking a blood sample to check your fasting blood sugar level. This should be done once every 3 years after age 45 if you are at a normal weight and without risk factors for diabetes. Testing should be considered at a younger age or be carried out more frequently if you are overweight and have at least 1 risk factor for diabetes.  Colorectal cancer can be detected and often prevented. Most routine colorectal cancer screening begins at the age of 50 and continues through age 75. However, your health care provider may recommend screening at an earlier age if you have risk factors for colon cancer. On a yearly basis, your health care provider may provide home test kits to check for hidden blood in the stool. A small camera at the end of a tube may be used to directly examine the colon (sigmoidoscopy or colonoscopy) to detect the earliest forms of colorectal cancer. Talk to your health care provider about this at age 50 when routine screening begins. A direct exam of the colon should be repeated every 5-10 years through age 75, unless early forms of precancerous polyps or small growths are found.  People who are at an increased risk for hepatitis B should be screened for this virus. You are considered at high risk for hepatitis B if:  You were born in a country where  hepatitis B occurs often. Talk with your health care provider about which countries are considered high risk.  Your parents were born in a high-risk country and you have not received a shot to protect against hepatitis B (hepatitis B vaccine).  You have HIV or AIDS.  You use needles to inject street drugs.  You live with, or have sex with, someone who has hepatitis B.  You are a man who has sex with other men (MSM).  You get hemodialysis treatment.  You take certain medicines for conditions like cancer, organ transplantation, and autoimmune conditions.  Hepatitis C blood testing is recommended for all people born from 1945 through 1965 and any individual with known risk factors for hepatitis C.  Healthy men should no longer receive prostate-specific antigen (PSA) blood tests as part of routine cancer screening. Talk to your   health care provider about prostate cancer screening.  Testicular cancer screening is not recommended for adolescents or adult males who have no symptoms. Screening includes self-exam, a health care provider exam, and other screening tests. Consult with your health care provider about any symptoms you have or any concerns you have about testicular cancer.  Practice safe sex. Use condoms and avoid high-risk sexual practices to reduce the spread of sexually transmitted infections (STIs).  You should be screened for STIs, including gonorrhea and chlamydia if:  You are sexually active and are younger than 24 years.  You are older than 24 years, and your health care provider tells you that you are at risk for this type of infection.  Your sexual activity has changed since you were last screened, and you are at an increased risk for chlamydia or gonorrhea. Ask your health care provider if you are at risk.  If you are at risk of being infected with HIV, it is recommended that you take a prescription medicine daily to prevent HIV infection. This is called pre-exposure  prophylaxis (PrEP). You are considered at risk if:  You are a man who has sex with other men (MSM).  You are a heterosexual man who is sexually active with multiple partners.  You take drugs by injection.  You are sexually active with a partner who has HIV.  Talk with your health care provider about whether you are at high risk of being infected with HIV. If you choose to begin PrEP, you should first be tested for HIV. You should then be tested every 3 months for as long as you are taking PrEP.  Use sunscreen. Apply sunscreen liberally and repeatedly throughout the day. You should seek shade when your shadow is shorter than you. Protect yourself by wearing long sleeves, pants, a wide-brimmed hat, and sunglasses year round whenever you are outdoors.  Tell your health care provider of new moles or changes in moles, especially if there is a change in shape or color. Also, tell your health care provider if a mole is larger than the size of a pencil eraser.  A one-time screening for abdominal aortic aneurysm (AAA) and surgical repair of large AAAs by ultrasound is recommended for men aged 65-75 years who are current or former smokers.  Stay current with your vaccines (immunizations).   This information is not intended to replace advice given to you by your health care provider. Make sure you discuss any questions you have with your health care provider.   Document Released: 02/23/2008 Document Revised: 09/17/2014 Document Reviewed: 01/22/2011 Elsevier Interactive Patient Education 2016 Elsevier Inc.   
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
## 2016-04-19 NOTE — Assessment & Plan Note (Signed)
1) Anticipatory Guidance: Discussed importance of wearing a seatbelt while driving and not texting while driving; changing batteries in smoke detector at least once annually; wearing suntan lotion when outside; eating a balanced and moderate diet; getting physical activity at least 30 minutes per day.  2) Immunizations / Screenings / Labs:  Tetanus updated today. He will schedule a time for his Pneumovax and flu shots. All other immunizations are up-to-date per recommendations. Due for a vision screen encouraged to be completed independently with appointment scheduled for 1 week. Declines hepatitis C screening. All other screenings are up-to-date per recommendations. Obtain CBC, CMET, and Lipid profile.    Overall well exam with risk factors for cardiovascular disease including diabetes, hypertension, hypercholesterolemia, and obesity. Chronic conditions appear to be adequately controlled through medication regimens. He continues to work with endocrinology for his diabetes which indicates is improving. Defer diabetes screening to endocrinology. Recommend weight loss of 5-10% of current body weight through nutrition and physical activity. Continue other healthy lifestyle behaviors and choices. Follow-up prevention exam in 1 year. Follow-up office visit for chronic conditions pending blood work.

## 2016-04-24 ENCOUNTER — Ambulatory Visit (INDEPENDENT_AMBULATORY_CARE_PROVIDER_SITE_OTHER): Payer: Managed Care, Other (non HMO) | Admitting: Endocrinology

## 2016-04-24 ENCOUNTER — Other Ambulatory Visit (INDEPENDENT_AMBULATORY_CARE_PROVIDER_SITE_OTHER): Payer: Managed Care, Other (non HMO)

## 2016-04-24 ENCOUNTER — Encounter: Payer: Self-pay | Admitting: Endocrinology

## 2016-04-24 VITALS — BP 115/81 | HR 65 | Temp 98.5°F | Resp 16 | Ht 73.0 in | Wt 264.4 lb

## 2016-04-24 DIAGNOSIS — E1142 Type 2 diabetes mellitus with diabetic polyneuropathy: Secondary | ICD-10-CM

## 2016-04-24 DIAGNOSIS — Z0000 Encounter for general adult medical examination without abnormal findings: Secondary | ICD-10-CM

## 2016-04-24 LAB — LIPID PANEL
CHOL/HDL RATIO: 5
Cholesterol: 194 mg/dL (ref 0–200)
HDL: 39.8 mg/dL (ref >39.00)
LDL CALC: 127 mg/dL — AB (ref 0–99)
NONHDL: 154.41
Triglycerides: 138 mg/dL (ref 0.0–149.0)
VLDL: 27.6 mg/dL (ref 0.0–40.0)

## 2016-04-24 LAB — CBC
HEMATOCRIT: 41.9 % (ref 39.0–52.0)
HEMOGLOBIN: 14.3 g/dL (ref 13.0–17.0)
MCHC: 34 g/dL (ref 30.0–36.0)
MCV: 91.5 fl (ref 78.0–100.0)
Platelets: 273 10*3/uL (ref 150.0–400.0)
RBC: 4.58 Mil/uL (ref 4.22–5.81)
RDW: 12.9 % (ref 11.5–15.5)
WBC: 6.5 10*3/uL (ref 4.0–10.5)

## 2016-04-24 LAB — TESTOSTERONE: Testosterone: 262.26 ng/dL — ABNORMAL LOW (ref 300.00–890.00)

## 2016-04-24 LAB — COMPREHENSIVE METABOLIC PANEL
ALT: 36 U/L (ref 0–53)
AST: 24 U/L (ref 0–37)
Albumin: 4.4 g/dL (ref 3.5–5.2)
Alkaline Phosphatase: 62 U/L (ref 39–117)
BUN: 21 mg/dL (ref 6–23)
CO2: 28 meq/L (ref 19–32)
Calcium: 9.9 mg/dL (ref 8.4–10.5)
Chloride: 100 mEq/L (ref 96–112)
Creatinine, Ser: 1.29 mg/dL (ref 0.40–1.50)
GFR: 74.65 mL/min (ref >60.00)
GLUCOSE: 240 mg/dL — AB (ref 70–99)
POTASSIUM: 4.9 meq/L (ref 3.5–5.1)
SODIUM: 136 meq/L (ref 135–145)
Total Bilirubin: 0.8 mg/dL (ref 0.2–1.2)
Total Protein: 7.7 g/dL (ref 6.0–8.3)

## 2016-04-24 LAB — POCT GLYCOSYLATED HEMOGLOBIN (HGB A1C): HEMOGLOBIN A1C: 7.8

## 2016-04-24 LAB — PSA: PSA: 0.84 ng/mL (ref 0.10–4.00)

## 2016-04-24 NOTE — Patient Instructions (Addendum)
Please continue the same insulin On this type of insulin schedule, you should eat meals on a regular schedule.  If a meal is missed or significantly delayed, your blood sugar could go low.   check your blood sugar twice a day.  vary the time of day when you check, between before the 3 meals, and at bedtime.  also check if you have symptoms of your blood sugar being too high or too low.  please keep a record of the readings and bring it to your next appointment here.  You can write it on any piece of paper.  please call us sooner if your blood sugar goes below 70, or if you have a lot of readings over 200.   Please come back for a follow-up appointment in 3 months.      
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
## 2016-05-24 ENCOUNTER — Telehealth: Payer: Self-pay | Admitting: Family

## 2016-05-24 NOTE — Telephone Encounter (Signed)
Received letter in regard to labs.  Patient is requesting call back to review what medication Marya Amsler is wanting to place him on.

## 2016-05-30 NOTE — Telephone Encounter (Signed)
Pt is willing to try one of those cholesterol medications. He said that it did not matter which one. He has had a problem with a cholesterol medication in the past causing leg pain. But he states he will just try it again. Did not know if you wanted to try him on a small dose or maybe 3 times a week. [facility] [address]

## 2016-05-31 MED ORDER — PRAVASTATIN SODIUM 20 MG PO TABS: 20.0000 mg | ORAL_TABLET | Freq: Every day | ORAL | 1 refills | Status: DC

## 2016-05-31 NOTE — Telephone Encounter (Signed)
Pravastatin sent to pharmacy 

## 2016-07-09 ENCOUNTER — Other Ambulatory Visit: Payer: Self-pay | Admitting: Internal Medicine

## 2016-07-11 ENCOUNTER — Other Ambulatory Visit: Payer: Self-pay

## 2016-07-11 MED ORDER — PRAVASTATIN SODIUM 20 MG PO TABS: 20.0000 mg | ORAL_TABLET | Freq: Every day | ORAL | 1 refills | Status: DC

## 2016-07-20 ENCOUNTER — Other Ambulatory Visit: Payer: Self-pay | Admitting: *Deleted

## 2016-07-20 MED ORDER — PRAVASTATIN SODIUM 20 MG PO TABS: 20.0000 mg | ORAL_TABLET | Freq: Every day | ORAL | 1 refills | Status: DC

## 2016-07-25 ENCOUNTER — Ambulatory Visit: Payer: Managed Care, Other (non HMO) | Admitting: Endocrinology

## 2016-07-25 DIAGNOSIS — Z0289 Encounter for other administrative examinations: Secondary | ICD-10-CM

## 2016-08-12 ENCOUNTER — Encounter (HOSPITAL_BASED_OUTPATIENT_CLINIC_OR_DEPARTMENT_OTHER): Payer: Self-pay | Admitting: Emergency Medicine

## 2016-08-12 ENCOUNTER — Emergency Department (HOSPITAL_BASED_OUTPATIENT_CLINIC_OR_DEPARTMENT_OTHER): Payer: Managed Care, Other (non HMO)

## 2016-08-12 ENCOUNTER — Emergency Department (HOSPITAL_BASED_OUTPATIENT_CLINIC_OR_DEPARTMENT_OTHER)
Admission: EM | Admit: 2016-08-12 | Discharge: 2016-08-12 | Disposition: A | Discharge status: Home / Self Care | Payer: Managed Care, Other (non HMO) | Attending: Emergency Medicine | Admitting: Emergency Medicine

## 2016-08-12 DIAGNOSIS — Z794 Long term (current) use of insulin: Secondary | ICD-10-CM | POA: Insufficient documentation

## 2016-08-12 DIAGNOSIS — I10 Essential (primary) hypertension: Secondary | ICD-10-CM | POA: Insufficient documentation

## 2016-08-12 DIAGNOSIS — S99921A Unspecified injury of right foot, initial encounter: Secondary | ICD-10-CM | POA: Diagnosis present

## 2016-08-12 DIAGNOSIS — X501XXA Overexertion from prolonged static or awkward postures, initial encounter: Secondary | ICD-10-CM | POA: Insufficient documentation

## 2016-08-12 DIAGNOSIS — Y9301 Activity, walking, marching and hiking: Secondary | ICD-10-CM | POA: Diagnosis not present

## 2016-08-12 DIAGNOSIS — E119 Type 2 diabetes mellitus without complications: Secondary | ICD-10-CM | POA: Insufficient documentation

## 2016-08-12 DIAGNOSIS — Z79899 Other long term (current) drug therapy: Secondary | ICD-10-CM | POA: Insufficient documentation

## 2016-08-12 DIAGNOSIS — Z85038 Personal history of other malignant neoplasm of large intestine: Secondary | ICD-10-CM | POA: Insufficient documentation

## 2016-08-12 DIAGNOSIS — Z87891 Personal history of nicotine dependence: Secondary | ICD-10-CM | POA: Insufficient documentation

## 2016-08-12 DIAGNOSIS — S93601A Unspecified sprain of right foot, initial encounter: Secondary | ICD-10-CM

## 2016-08-12 DIAGNOSIS — Y929 Unspecified place or not applicable: Secondary | ICD-10-CM | POA: Diagnosis not present

## 2016-08-12 DIAGNOSIS — Y999 Unspecified external cause status: Secondary | ICD-10-CM | POA: Insufficient documentation

## 2016-08-12 NOTE — ED Triage Notes (Signed)
Pt injured foot in unknown way while walking dog this morning and avoiding another dog off leash.  Pt states pain in right foot began to worsen once home and can now no longer bear weight on right foot.  Pt took 800mg  ibuprofen at approx 1200 today.

## 2016-08-12 NOTE — ED Provider Notes (Signed)
[facility] Provider Note   CSN: [id number] Arrival date & time: 08/12/16  1458  By signing my name below, I, Hansel Feinstein, attest that this documentation has been prepared under the direction and in the presence of Fatima Blank, MD. Electronically Signed: Hansel Feinstein, ED Scribe. 08/12/16. 4:21 PM.     History   Chief Complaint Chief Complaint  Patient presents with  . Foot Pain    HPI William Barber is a 55 y.o. male who presents to the Emergency Department complaining of moderate lateral right foot pain s/p injury that occurred 4 hours ago. Pt states he was walking his dog in the grass, stepped in a hole and rolled his foot, causing his injury. He denies falls, head injury or LOC. He states he kept walking after the incident and began to feel worsening pain only after he sat and rested. He states his pain is worsened with toe movement and weight-bearing. He reports some relief with ice application in the ED. He denies numbness, additional injuries.   The history is provided by the patient. No language interpreter was used.    Past Medical History:  Diagnosis Date  . Colon cancer ([place]) 06/05/12 dx   invasive mod diff adenocarcinoma,invading muscularis propria into pericoloniic fatty tissue(0/31)lymh node neg  . Hypercholesterolemia   . Hypertension   . IBS (irritable bowel syndrome)   . IBS (irritable bowel syndrome)   . Liver mass on CT Sep2013 05/19/2012  . Type 2 diabetes mellitus, uncontrolled ([place])    dx age 43    Patient Active Problem List   Diagnosis Date Noted  . Routine general medical examination at a health care facility 04/19/2016  . Diabetes ([place]) 01/20/2016  . Diabetic polyneuropathy associated with type 2 diabetes mellitus ([place]) 11/21/2015  . HTN (hypertension) 06/06/2012  . Hypercholesterolemia 06/06/2012  . Cancer of sigmoid colon, pT3pN0 (0/31 LN) 05/19/2012  . Liver mass on CT Sep2013 05/19/2012    Past Surgical History:  Procedure  Laterality Date  . COLONOSCOPY W/ BIOPSIES  05/09/12   mass distal sigmoid colon lesion bx'd  . KNEE SURGERY  1987   Left  . LAPAROSCOPIC INCISIONAL / UMBILICAL / VENTRAL HERNIA REPAIR  06/05/12   DR.Groat,  low anterior resection  . PROCTOSCOPY  06/05/2012   Procedure: PROCTOSCOPY;  Surgeon: Adin Hector, MD;  Location[facility];  Service: General;;  rigid proctoscopy   . UMBILICAL HERNIA REPAIR  06/05/2012   Procedure: HERNIA REPAIR UMBILICAL ADULT;  Surgeon: Adin Hector, MD;  Location[facility];  Service: General;  Laterality: N/A;       Home Medications    Prior to Admission medications   Medication Sig Start Date End Date Taking? Authorizing Provider  enalapril (VASOTEC) 20 MG tablet TAKE 1 TABLET BY MOUTH ONCE DAILY EVERY MORNING 07/09/16  Yes Golden [facility], FNP  glucose blood (ONE TOUCH ULTRA TEST) test strip Use as instructed to check blood sugar six times daily dx 250.02 11/20/12  Yes Rowe Clack, MD  insulin glargine (LANTUS) 100 UNIT/ML injection Inject 1.1 mLs (110 Units total) into the skin every morning. And syringes 1/day 11/21/15  Yes Renato Shin, MD  Insulin Pen Needle 32G X 4 MM MISC Use as directed 3 times daily 11/13/12  Yes Jearld Fenton, NP  Insulin Syringe-Needle U-100 (B-D INS SYR ULTRAFINE 1CC/31G) 31G X 5/16" 1 ML MISC USE FOUR TIMES A DAY TO ADMINISTER INSULIN 03/16/16  Yes Renato Shin, MD  pravastatin (PRAVACHOL)  20 MG tablet Take 1 tablet (20 mg total) by mouth daily. 07/20/16  Yes Golden Circle, FNP    Family History Family History  Problem Relation Age of Onset  . Diabetes Mother   . Cancer Sister     cervical cancer  . Cancer Paternal Grandfather     colon cancer    Social History Social History  Substance Use Topics  . Smoking status: Former Smoker    Types: Cigarettes    Quit date: 06/25/1985  . Smokeless tobacco: Never Used     Comment: 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 [employer]  . Alcohol use Yes     Comment: beer occasionally      Allergies   Hydromorphone hcl; Iodinated diagnostic agents; and Morphine and related   Review of Systems Review of Systems A complete 10 system review of systems was obtained and all systems are negative except as noted in the HPI and PMH.    Physical Exam Updated Vital Signs BP 146/86 (BP Location: Left Arm)   Pulse 69   Temp 98.4 F (36.9 C) (Oral)   Resp 16   Ht 6' (1.829 m)   Wt 250 lb (113.4 kg)   SpO2 96%   BMI 33.91 kg/m   Physical Exam  Constitutional: He is oriented to person, place, and time. He appears well-developed and well-nourished. No distress.  HENT:  Head: Normocephalic and atraumatic.  Right Ear: External ear normal.  Left Ear: External ear normal.  Nose: Nose normal.  Mouth/Throat: Mucous membranes are normal. No trismus in the jaw.  Eyes: Conjunctivae and EOM are normal. No scleral icterus.  Neck: Normal range of motion and phonation normal.  Cardiovascular: Normal rate and regular rhythm.   DP pulses 2+ and equal.   Pulmonary/Chest: Effort normal. No stridor. No respiratory distress.  Abdominal: He exhibits no distension.  Musculoskeletal: Normal range of motion. He exhibits no edema.       Right ankle: He exhibits normal range of motion, no swelling, no ecchymosis, no deformity, no laceration and normal pulse. No lateral malleolus, no medial malleolus, no AITFL, no CF ligament, no posterior TFL, no head of 5th metatarsal and no proximal fibula tenderness found. Achilles tendon exhibits no pain.       Right foot: There is tenderness, bony tenderness and swelling (mild). There is no crepitus, no deformity and no laceration.       Feet:  Neurological: He is alert and oriented to person, place, and time.  Skin: Capillary refill takes less than 2 seconds. He is not diaphoretic.  Psychiatric: He has a normal mood and affect. His behavior is normal.  Vitals reviewed.    ED Treatments / Results   DIAGNOSTIC STUDIES: Oxygen Saturation is 96% on  RA, adequate by my interpretation.    COORDINATION OF CARE: 4:16 PM Discussed treatment plan with pt at bedside which includes XR, RICE method and pt agreed to plan.    Labs (all labs ordered are listed, but only abnormal results are displayed) Labs Reviewed - No data to display  EKG  EKG Interpretation None       Radiology Dg Foot Complete Right  Result Date: 08/12/2016 CLINICAL DATA:  Right anterior foot pain after injury today, pt states he was walking his dog and is unsure if he stepped on a rock or in a hole Unable to bear weight on right foot EXAM: RIGHT FOOT COMPLETE - 3+ VIEW COMPARISON:  None. FINDINGS: No fracture.  No bone lesion. The joints  are normally spaced and aligned. Mild spurring noted from the dorsal anterior talus and dorsal navicular. Small plantar calcaneal spur. Soft tissues are unremarkable. IMPRESSION: No fracture or dislocation. Electronically Signed   By: Lajean Manes M.D.   On: 08/12/2016 15:45    Procedures Procedures (including critical care time)  Medications Ordered in ED Medications - No data to display   Initial Impression / Assessment and Plan / ED Course  I have reviewed the triage vital signs and the nursing notes.  Pertinent labs & imaging results that were available during my care of the patient were reviewed by me and considered in my medical decision making (see chart for details).  Clinical Course as of Aug 12 1630  Sun Aug 12, 2016  1625 Plain film without evidence of acute fracture or dislocations. Patient without any ankle tenderness or distal lower leg tenderness warranting ankle imaging. Likely a foot sprain. Discussed RICE and symptomatic treatment. Provided with Ace bandage and air splint.  Safe for discharge with strict return precautions.  [PC]    Clinical Course User Index [PC] Fatima Blank, MD      Final Clinical Impressions(s) / ED Diagnoses   Final diagnoses:  Foot sprain, right, initial encounter    Disposition: Discharge  Condition: Good  I have discussed the results, Dx and Tx plan with the patient who expressed understanding and agree(s) with the plan. Discharge instructions discussed at great length. The patient was given strict return precautions who verbalized understanding of the instructions. No further questions at time of discharge.    Current Discharge Medication List      Follow Up: [facility], [place] [address] [phone]  Schedule an appointment as soon as possible for a visit  As needed  Mcarthur Rossetti, MD [address] [place] [address] [phone]  Schedule an appointment as soon as possible for a visit  As needed, If symptoms do not improve or  worsen in 10-14 days   I personally performed the services described in this documentation, which was scribed in my presence. The recorded information has been reviewed and is accurate.        Fatima Blank, MD 08/12/16 [phone]

## 2016-08-12 NOTE — ED Notes (Signed)
Pt given d/c instructions as per chart. Verbalizes understanding. No questions. 

## 2016-11-18 ENCOUNTER — Other Ambulatory Visit: Payer: Self-pay | Admitting: Endocrinology

## 2016-11-18 NOTE — Telephone Encounter (Signed)
Please refill x 1 Ov is due  

## 2017-02-06 ENCOUNTER — Encounter: Payer: Self-pay | Admitting: Endocrinology

## 2017-02-06 ENCOUNTER — Ambulatory Visit (INDEPENDENT_AMBULATORY_CARE_PROVIDER_SITE_OTHER): Payer: 59 | Admitting: Endocrinology

## 2017-02-06 VITALS — BP 132/86 | HR 72 | Ht 73.0 in | Wt 254.0 lb

## 2017-02-06 DIAGNOSIS — E119 Type 2 diabetes mellitus without complications: Secondary | ICD-10-CM | POA: Diagnosis not present

## 2017-02-06 DIAGNOSIS — Z794 Long term (current) use of insulin: Secondary | ICD-10-CM

## 2017-02-06 LAB — POCT GLYCOSYLATED HEMOGLOBIN (HGB A1C): Hemoglobin A1C: 8.4

## 2017-02-06 MED ORDER — INSULIN GLARGINE 100 UNIT/ML ~~LOC~~ SOLN: 80.0000 [IU] | SUBCUTANEOUS | 0 refills | Status: DC

## 2017-02-06 MED ORDER — INSULIN LISPRO 100 UNIT/ML (KWIKPEN): 10.0000 [IU] | PEN_INJECTOR | Freq: Every day | SUBCUTANEOUS | 11 refills | Status: DC
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
## 2017-02-06 NOTE — Patient Instructions (Addendum)
Please reduce the lantus to 80 units each morning, and: Add humalog 10-20 units (depending on the size of the meal), with supper. On this type of insulin schedule, you should eat meals on a regular schedule.  If a meal is missed or significantly delayed, your blood sugar could go low.   check your blood sugar twice a day.  vary the time of day when you check, between before the 3 meals, and at bedtime.  also check if you have symptoms of your blood sugar being too high or too low.  please keep a record of the readings and bring it to your next appointment here.  You can write it on any piece of paper.  please call us sooner if your blood sugar goes below 70, or if you have a lot of readings over 200.  Please come back for a follow-up appointment in 3 months.

## 2017-02-13 ENCOUNTER — Telehealth: Payer: Self-pay | Admitting: Endocrinology

## 2017-02-13 ENCOUNTER — Other Ambulatory Visit: Payer: Self-pay

## 2017-02-13 MED ORDER — BASAGLAR KWIKPEN 100 UNIT/ML ~~LOC~~ SOPN: PEN_INJECTOR | SUBCUTANEOUS | 0 refills | Status: DC

## 2017-02-13 NOTE — Telephone Encounter (Signed)
Next to try is "basaglar."  Same dosage.

## 2017-02-13 NOTE — Telephone Encounter (Signed)
Submitted basaglar

## 2017-02-13 NOTE — Telephone Encounter (Signed)
Patient calling to inform that his insurance will no longer cover insulin glargine (LANTUS) 100 UNIT/ML injection [[id number]]   Asking to be changed to something else so it will be covered.

## 2017-02-16 ENCOUNTER — Other Ambulatory Visit: Payer: Self-pay | Admitting: Family

## 2017-02-19 ENCOUNTER — Telehealth: Payer: Self-pay | Admitting: Endocrinology

## 2017-02-19 MED ORDER — INSULIN PEN NEEDLE 32G X 4 MM MISC: 2 refills | Status: DC

## 2017-02-19 NOTE — Telephone Encounter (Signed)
Refill submitted and patient notified.  

## 2017-02-19 NOTE — Telephone Encounter (Signed)
**  Remind patient they can make refill requests via MyChart**  Medication refill request (Name & Dosage):  Patient needs the KwikPen NEEDLES   Preferred pharmacy (Name & Address):  [id number] - [place], [place] [address] [phone] (Phone) [phone] (Fax)   Other comments (if applicable):   Patient needs the needles. Please advise pharmacy and call patient when rx has been made. Okay to leave a detailed message on phone.

## 2017-05-09 ENCOUNTER — Ambulatory Visit: Payer: 59 | Admitting: Endocrinology

## 2017-05-18 ENCOUNTER — Other Ambulatory Visit: Payer: Self-pay | Admitting: Family

## 2017-05-19 ENCOUNTER — Other Ambulatory Visit: Payer: Self-pay | Admitting: Family

## 2017-05-19 ENCOUNTER — Other Ambulatory Visit: Payer: Self-pay | Admitting: Endocrinology

## 2017-05-29 ENCOUNTER — Encounter: Payer: Self-pay | Admitting: Endocrinology

## 2017-05-29 ENCOUNTER — Ambulatory Visit (INDEPENDENT_AMBULATORY_CARE_PROVIDER_SITE_OTHER): Payer: 59 | Admitting: Endocrinology

## 2017-05-29 VITALS — BP 128/88 | HR 64 | Wt 250.6 lb

## 2017-05-29 DIAGNOSIS — Z794 Long term (current) use of insulin: Secondary | ICD-10-CM | POA: Diagnosis not present

## 2017-05-29 DIAGNOSIS — E119 Type 2 diabetes mellitus without complications: Secondary | ICD-10-CM

## 2017-05-29 LAB — POCT GLYCOSYLATED HEMOGLOBIN (HGB A1C): Hemoglobin A1C: 8.3

## 2017-05-29 MED ORDER — INSULIN LISPRO 100 UNIT/ML (KWIKPEN): 15.0000 [IU] | PEN_INJECTOR | Freq: Every day | SUBCUTANEOUS | 11 refills | Status: DC

## 2017-05-29 NOTE — Patient Instructions (Signed)
Please continue the same basaglar: 80 units each morning, and: increase humalog to 15-25 units (depending on the size of the meal), with supper. On this type of insulin schedule, you should eat meals on a regular schedule.  If a meal is missed or significantly delayed, your blood sugar could go low.   check your blood sugar twice a day.  vary the time of day when you check, between before the 3 meals, and at bedtime.  also check if you have symptoms of your blood sugar being too high or too low.  please keep a record of the readings and bring it to your next appointment here.  You can write it on any piece of paper.  please call us sooner if your blood sugar goes below 70, or if you have a lot of readings over 200.  Please come back for a follow-up appointment in 2 months.
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
## 2017-07-10 ENCOUNTER — Other Ambulatory Visit: Payer: Self-pay | Admitting: Family

## 2017-07-12 ENCOUNTER — Telehealth: Payer: Self-pay | Admitting: *Deleted

## 2017-07-18 MED ORDER — PRAVASTATIN SODIUM 20 MG PO TABS: 20.0000 mg | ORAL_TABLET | Freq: Every day | ORAL | 0 refills | Status: DC

## 2017-07-18 NOTE — Addendum Note (Signed)
Addended by: Earnstine Regal on: 07/18/2017 11:23 AM   Modules accepted: Orders

## 2017-07-18 NOTE — Telephone Encounter (Signed)
Pt is scheduled to see Dr Jenny Reichmann on 09/06/17.

## 2017-07-18 NOTE — Telephone Encounter (Signed)
Per office policy sent 30 day to local pharmacy until appt.../lmb  

## 2017-07-19 ENCOUNTER — Telehealth: Payer: Self-pay | Admitting: Family

## 2017-07-19 MED ORDER — ENALAPRIL MALEATE 20 MG PO TABS: ORAL_TABLET | ORAL | 0 refills | Status: DC

## 2017-07-19 NOTE — Telephone Encounter (Signed)
Rx refilled for 30 day supply. Pt has not been seen in over a year.

## 2017-07-19 NOTE — Telephone Encounter (Signed)
Pt called in and said he needed a refill on his enalapril (VASOTEC) 20 MG tablet [[id number]] He did not get on oct 30th

## 2017-07-30 ENCOUNTER — Ambulatory Visit: Payer: 59 | Admitting: Endocrinology

## 2017-07-30 ENCOUNTER — Encounter: Payer: Self-pay | Admitting: Endocrinology

## 2017-07-30 ENCOUNTER — Ambulatory Visit (INDEPENDENT_AMBULATORY_CARE_PROVIDER_SITE_OTHER): Payer: 59 | Admitting: Endocrinology

## 2017-07-30 VITALS — BP 130/80 | HR 73 | Wt 250.6 lb

## 2017-07-30 DIAGNOSIS — E119 Type 2 diabetes mellitus without complications: Secondary | ICD-10-CM | POA: Diagnosis not present

## 2017-07-30 DIAGNOSIS — Z794 Long term (current) use of insulin: Secondary | ICD-10-CM | POA: Diagnosis not present

## 2017-07-30 LAB — POCT GLYCOSYLATED HEMOGLOBIN (HGB A1C): Hemoglobin A1C: 8.6

## 2017-07-30 MED ORDER — INSULIN LISPRO 100 UNIT/ML (KWIKPEN): 20.0000 [IU] | PEN_INJECTOR | Freq: Every day | SUBCUTANEOUS | 11 refills | Status: DC

## 2017-07-30 MED ORDER — BASAGLAR KWIKPEN 100 UNIT/ML ~~LOC~~ SOPN: 90.0000 [IU] | PEN_INJECTOR | SUBCUTANEOUS | 11 refills | Status: DC
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
## 2017-07-30 NOTE — Patient Instructions (Addendum)
Please increase the basaglar to 90 units each morning, and: increase humalog to 20-30 units (depending on the size of the meal), with supper.  On this type of insulin schedule, you should eat meals on a regular schedule.  If a meal is missed or significantly delayed, your blood sugar could go low.   check your blood sugar twice a day.  vary the time of day when you check, between before the 3 meals, and at bedtime.  also check if you have symptoms of your blood sugar being too high or too low.  please keep a record of the readings and bring it to your next appointment here.  You can write it on any piece of paper.  please call us sooner if your blood sugar goes below 70, or if you have a lot of readings over 200.  Please come back for a follow-up appointment in 2 months.

## 2017-08-15 ENCOUNTER — Other Ambulatory Visit: Payer: Self-pay | Admitting: Internal Medicine

## 2017-08-19 ENCOUNTER — Other Ambulatory Visit: Payer: Self-pay | Admitting: Internal Medicine

## 2017-08-27 ENCOUNTER — Other Ambulatory Visit: Payer: Self-pay

## 2017-08-27 ENCOUNTER — Telehealth: Payer: Self-pay | Admitting: Endocrinology

## 2017-08-27 MED ORDER — INSULIN LISPRO 100 UNIT/ML (KWIKPEN): 20.0000 [IU] | PEN_INJECTOR | Freq: Every day | SUBCUTANEOUS | 11 refills | Status: DC

## 2017-08-27 NOTE — Telephone Encounter (Signed)
Done

## 2017-08-27 NOTE — Telephone Encounter (Signed)
Patient needs prescription for Humalog sent to [facility] on [facility] [address] [facility] in [place]

## 2017-09-06 ENCOUNTER — Other Ambulatory Visit (INDEPENDENT_AMBULATORY_CARE_PROVIDER_SITE_OTHER): Payer: 59

## 2017-09-06 ENCOUNTER — Encounter: Payer: Self-pay | Admitting: Internal Medicine

## 2017-09-06 ENCOUNTER — Ambulatory Visit (INDEPENDENT_AMBULATORY_CARE_PROVIDER_SITE_OTHER): Payer: 59 | Admitting: Internal Medicine

## 2017-09-06 VITALS — BP 138/90 | HR 60 | Temp 97.9°F | Ht 73.0 in | Wt 253.0 lb

## 2017-09-06 DIAGNOSIS — K589 Irritable bowel syndrome without diarrhea: Secondary | ICD-10-CM | POA: Insufficient documentation

## 2017-09-06 DIAGNOSIS — E119 Type 2 diabetes mellitus without complications: Secondary | ICD-10-CM

## 2017-09-06 DIAGNOSIS — Z23 Encounter for immunization: Secondary | ICD-10-CM | POA: Diagnosis not present

## 2017-09-06 DIAGNOSIS — Z114 Encounter for screening for human immunodeficiency virus [HIV]: Secondary | ICD-10-CM

## 2017-09-06 DIAGNOSIS — R7989 Other specified abnormal findings of blood chemistry: Secondary | ICD-10-CM

## 2017-09-06 DIAGNOSIS — Z0000 Encounter for general adult medical examination without abnormal findings: Secondary | ICD-10-CM

## 2017-09-06 DIAGNOSIS — Z1159 Encounter for screening for other viral diseases: Secondary | ICD-10-CM | POA: Diagnosis not present

## 2017-09-06 DIAGNOSIS — Z794 Long term (current) use of insulin: Secondary | ICD-10-CM

## 2017-09-06 DIAGNOSIS — N529 Male erectile dysfunction, unspecified: Secondary | ICD-10-CM | POA: Insufficient documentation

## 2017-09-06 DIAGNOSIS — R42 Dizziness and giddiness: Secondary | ICD-10-CM

## 2017-09-06 HISTORY — DX: Other specified abnormal findings of blood chemistry: R79.89

## 2017-09-06 HISTORY — DX: Dizziness and giddiness: R42

## 2017-09-06 LAB — TSH: TSH: 1.72 u[IU]/mL (ref 0.35–4.50)

## 2017-09-06 LAB — CBC WITH DIFFERENTIAL/PLATELET
BASOS ABS: 0 10*3/uL (ref 0.0–0.1)
Basophils Relative: 0.7 % (ref 0.0–3.0)
EOS ABS: 0.1 10*3/uL (ref 0.0–0.7)
Eosinophils Relative: 1.8 % (ref 0.0–5.0)
HEMATOCRIT: 43.7 % (ref 39.0–52.0)
HEMOGLOBIN: 14.5 g/dL (ref 13.0–17.0)
LYMPHS PCT: 29.7 % (ref 12.0–46.0)
Lymphs Abs: 1.6 10*3/uL (ref 0.7–4.0)
MCHC: 33.3 g/dL (ref 30.0–36.0)
MCV: 94.1 fl (ref 78.0–100.0)
Monocytes Absolute: 0.6 10*3/uL (ref 0.1–1.0)
Monocytes Relative: 10.7 % (ref 3.0–12.0)
Neutro Abs: 3.1 10*3/uL (ref 1.4–7.7)
Neutrophils Relative %: 57.1 % (ref 43.0–77.0)
PLATELETS: 276 10*3/uL (ref 150.0–400.0)
RBC: 4.65 Mil/uL (ref 4.22–5.81)
RDW: 12.6 % (ref 11.5–15.5)
WBC: 5.4 10*3/uL (ref 4.0–10.5)

## 2017-09-06 LAB — URINALYSIS, ROUTINE W REFLEX MICROSCOPIC
BILIRUBIN URINE: NEGATIVE
Ketones, ur: NEGATIVE
LEUKOCYTES UA: NEGATIVE
NITRITE: NEGATIVE
Specific Gravity, Urine: 1.025 (ref 1.000–1.030)
Total Protein, Urine: NEGATIVE
Urine Glucose: NEGATIVE
Urobilinogen, UA: 1 (ref 0.0–1.0)
pH: 6.5 (ref 5.0–8.0)

## 2017-09-06 LAB — BASIC METABOLIC PANEL
BUN: 19 mg/dL (ref 6–23)
CHLORIDE: 100 meq/L (ref 96–112)
CO2: 30 mEq/L (ref 19–32)
CREATININE: 1.29 mg/dL (ref 0.40–1.50)
Calcium: 9.3 mg/dL (ref 8.4–10.5)
GFR: 74.27 mL/min (ref >60.00)
Glucose, Bld: 178 mg/dL — ABNORMAL HIGH (ref 70–99)
Potassium: 4.2 mEq/L (ref 3.5–5.1)
Sodium: 137 mEq/L (ref 135–145)

## 2017-09-06 LAB — MICROALBUMIN / CREATININE URINE RATIO
CREATININE, U: 192.2 mg/dL
MICROALB UR: 2.9 mg/dL — AB (ref 0.0–1.9)
Microalb Creat Ratio: 1.5 mg/g (ref 0.0–30.0)

## 2017-09-06 LAB — LIPID PANEL
CHOLESTEROL: 171 mg/dL (ref 0–200)
HDL: 32.8 mg/dL — ABNORMAL LOW (ref >39.00)
LDL CALC: 108 mg/dL — AB (ref 0–99)
NonHDL: 138.13
Total CHOL/HDL Ratio: 5
Triglycerides: 150 mg/dL — ABNORMAL HIGH (ref 0.0–149.0)
VLDL: 30 mg/dL (ref 0.0–40.0)

## 2017-09-06 LAB — HEPATIC FUNCTION PANEL
ALK PHOS: 59 U/L (ref 39–117)
ALT: 31 U/L (ref 0–53)
AST: 24 U/L (ref 0–37)
Albumin: 4.4 g/dL (ref 3.5–5.2)
BILIRUBIN DIRECT: 0.2 mg/dL (ref 0.0–0.3)
TOTAL PROTEIN: 7.6 g/dL (ref 6.0–8.3)
Total Bilirubin: 1.2 mg/dL (ref 0.2–1.2)

## 2017-09-06 LAB — PSA: PSA: 0.71 ng/mL (ref 0.10–4.00)

## 2017-09-06 MED ORDER — ASPIRIN 81 MG PO TBEC: 81.0000 mg | DELAYED_RELEASE_TABLET | Freq: Every day | ORAL | 12 refills | Status: AC

## 2017-09-06 MED ORDER — PRAVASTATIN SODIUM 20 MG PO TABS: ORAL_TABLET | ORAL | 3 refills | Status: DC

## 2017-09-06 MED ORDER — SILDENAFIL CITRATE 100 MG PO TABS: 50.0000 mg | ORAL_TABLET | Freq: Every day | ORAL | 11 refills | Status: DC | PRN

## 2017-09-06 NOTE — Patient Instructions (Addendum)
You had the flu shot today  Please return in 2 weeks for a Nurse Visit for the Prevnar 13 shot  Please start the Aspirin 81 mg - 1 per day  - (coated only)  Please continue all other medications as before, and refills have been done if requested.  Please have the pharmacy call with any other refills you may need.  Please continue your efforts at being more active, low cholesterol diet, and weight control.  You are otherwise up to date with prevention measures today.  Please keep your appointments with your specialists as you may have planned  Please go to the LAB in the Basement (turn left off the elevator) for the tests to be done today  You will be contacted by phone if any changes need to be made immediately.  Otherwise, you will receive a letter about your results with an explanation, but please check with MyChart first.  Please remember to sign up for MyChart if you have not done so, as this will be important to you in the future with finding out test results, communicating by private email, and scheduling acute appointments online when needed.  Please return in 1 year for your yearly visit, or sooner if needed, with Lab testing done 3-5 days before
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
## 2017-09-07 ENCOUNTER — Encounter: Payer: Self-pay | Admitting: Internal Medicine

## 2017-09-07 LAB — HEPATITIS C ANTIBODY
HEP C AB: NONREACTIVE
SIGNAL TO CUT-OFF: 0.05 (ref <1.00)

## 2017-09-07 LAB — HIV ANTIBODY (ROUTINE TESTING W REFLEX): HIV 1&2 Ab, 4th Generation: NONREACTIVE

## 2017-09-07 NOTE — Assessment & Plan Note (Addendum)
stable overall by history and exam, recent data reviewed with pt, plans to f/u with endo, and pt to continue medical treatment as before,  to f/u any worsening symptoms or concerns

## 2017-09-07 NOTE — Assessment & Plan Note (Signed)
Ok for viagra prn,  to f/u any worsening symptoms or concerns  

## 2017-09-07 NOTE — Assessment & Plan Note (Signed)

## 2017-09-13 ENCOUNTER — Other Ambulatory Visit: Payer: Self-pay

## 2017-09-13 MED ORDER — ENALAPRIL MALEATE 20 MG PO TABS: ORAL_TABLET | ORAL | 3 refills | Status: DC

## 2017-09-20 ENCOUNTER — Ambulatory Visit (INDEPENDENT_AMBULATORY_CARE_PROVIDER_SITE_OTHER): Payer: 59

## 2017-09-20 DIAGNOSIS — Z23 Encounter for immunization: Secondary | ICD-10-CM | POA: Diagnosis not present

## 2017-09-20 DIAGNOSIS — Z299 Encounter for prophylactic measures, unspecified: Secondary | ICD-10-CM

## 2017-10-02 ENCOUNTER — Encounter: Payer: Self-pay | Admitting: Endocrinology

## 2017-10-02 ENCOUNTER — Ambulatory Visit (INDEPENDENT_AMBULATORY_CARE_PROVIDER_SITE_OTHER): Payer: 59 | Admitting: Endocrinology

## 2017-10-02 VITALS — BP 160/90 | HR 66 | Wt 256.2 lb

## 2017-10-02 DIAGNOSIS — Z794 Long term (current) use of insulin: Secondary | ICD-10-CM | POA: Diagnosis not present

## 2017-10-02 DIAGNOSIS — E119 Type 2 diabetes mellitus without complications: Secondary | ICD-10-CM | POA: Diagnosis not present

## 2017-10-02 LAB — POCT GLYCOSYLATED HEMOGLOBIN (HGB A1C): Hemoglobin A1C: 7.4

## 2017-10-02 MED ORDER — INSULIN LISPRO 100 UNIT/ML (KWIKPEN): 35.0000 [IU] | PEN_INJECTOR | Freq: Every day | SUBCUTANEOUS | 11 refills | Status: DC

## 2017-10-02 MED ORDER — BASAGLAR KWIKPEN 100 UNIT/ML ~~LOC~~ SOPN: 85.0000 [IU] | PEN_INJECTOR | SUBCUTANEOUS | 11 refills | Status: DC

## 2017-10-02 NOTE — Patient Instructions (Addendum)
Please decrease the basaglar to 85 units each morning, and: increase humalog to 35 units with supper.  On this type of insulin schedule, you should eat meals on a regular schedule.  If a meal is missed or significantly delayed, your blood sugar could go low.   check your blood sugar twice a day.  vary the time of day when you check, between before the 3 meals, and at bedtime.  also check if you have symptoms of your blood sugar being too high or too low.  please keep a record of the readings and bring it to your next appointment here.  You can write it on any piece of paper.  please call us sooner if your blood sugar goes below 70, or if you have a lot of readings over 200.  Please come back for a follow-up appointment in 4 months.
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
## 2017-11-04 ENCOUNTER — Telehealth: Payer: Self-pay

## 2017-11-04 MED ORDER — INSULIN GLARGINE 100 UNIT/ML SOLOSTAR PEN: PEN_INJECTOR | SUBCUTANEOUS | 1 refills | Status: DC

## 2017-11-04 NOTE — Telephone Encounter (Signed)
lantus is ok.  Same dosage

## 2017-11-04 NOTE — Addendum Note (Signed)
Addended by: Drucilla Schmidt on: 11/04/2017 04:58 PM   Modules accepted: Orders

## 2017-11-04 NOTE — Telephone Encounter (Signed)
Pharmacy is requesting an alternative medication be sent in because Basaglar is no longer covered for the pt. Please advise

## 2017-11-04 NOTE — Telephone Encounter (Signed)
sent 

## 2017-11-05 ENCOUNTER — Telehealth: Payer: Self-pay | Admitting: Endocrinology

## 2017-11-06 ENCOUNTER — Encounter: Payer: Self-pay | Admitting: Endocrinology

## 2017-11-25 ENCOUNTER — Telehealth: Payer: Self-pay | Admitting: Endocrinology

## 2017-11-25 MED ORDER — INSULIN GLARGINE 100 UNIT/ML SOLOSTAR PEN: PEN_INJECTOR | SUBCUTANEOUS | 1 refills | Status: DC

## 2017-11-25 NOTE — Telephone Encounter (Signed)
Patient stated that during his last visit Dr Loanne Drilling was changing his prescription and sending it in. Patient stated that pharmacy has not received  a new prescription from dr and patient is not sure they name of this new prescription     [id number] [place], [place] - [place]

## 2017-11-25 NOTE — Telephone Encounter (Signed)
I contacted the patient and advised via voicemail Dr. Loanne Drilling has changed his basaglar to lantus due to insurance not covering. I advised this prescription had been sent back in [place], but we would send it in again today. Patient advised to call back if he had any further questions.

## 2017-12-02 ENCOUNTER — Other Ambulatory Visit: Payer: Self-pay

## 2017-12-02 ENCOUNTER — Telehealth: Payer: Self-pay | Admitting: Endocrinology

## 2017-12-02 MED ORDER — INSULIN GLARGINE 100 UNIT/ML SOLOSTAR PEN: PEN_INJECTOR | SUBCUTANEOUS | 1 refills | Status: DC

## 2017-12-02 NOTE — Telephone Encounter (Signed)
Patient stated his medication has to be written for 90 days otherwise his insurance won't cover it so he needs it to be changed, to 90 day supply.  [id number] [address], [place] [phone] (Phone) [phone] (Fax)

## 2017-12-02 NOTE — Telephone Encounter (Signed)
I called & asked patient to call back to verify which meds needed to be sent for 90 day supply. I have already resent the Lantus.

## 2018-01-09 ENCOUNTER — Other Ambulatory Visit: Payer: Self-pay | Admitting: Endocrinology

## 2018-01-09 ENCOUNTER — Telehealth: Payer: Self-pay | Admitting: Endocrinology

## 2018-01-09 NOTE — Telephone Encounter (Signed)
I have changed prescription to Novolog per patient's insurance.

## 2018-01-09 NOTE — Telephone Encounter (Signed)
Patient's insurance does Not cover Humalog. They Do cover Novalog. Please send RX for [facility] [address] [facility] [address]

## 2018-01-30 ENCOUNTER — Encounter: Payer: Self-pay | Admitting: Endocrinology

## 2018-01-30 ENCOUNTER — Ambulatory Visit (INDEPENDENT_AMBULATORY_CARE_PROVIDER_SITE_OTHER): Payer: BLUE CROSS/BLUE SHIELD | Admitting: Endocrinology

## 2018-01-30 VITALS — BP 158/78 | HR 71 | Wt 259.0 lb

## 2018-01-30 DIAGNOSIS — Z794 Long term (current) use of insulin: Secondary | ICD-10-CM

## 2018-01-30 DIAGNOSIS — E119 Type 2 diabetes mellitus without complications: Secondary | ICD-10-CM | POA: Diagnosis not present

## 2018-01-30 LAB — POCT GLYCOSYLATED HEMOGLOBIN (HGB A1C): Hemoglobin A1C: 8.3 % — AB (ref 4.0–5.6)
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
## 2018-01-30 NOTE — Patient Instructions (Addendum)
Please continue the same insulins. Your blood pressure is high today.  Please see your primary care provider soon, to have it rechecked If in the future you are unable to get insulin through insurance, you can buy [facility] type insulins for $25 per bottle On this type of insulin schedule, you should eat meals on a regular schedule.  If a meal is missed or significantly delayed, your blood sugar could go low.   check your blood sugar twice a day.  vary the time of day when you check, between before the 3 meals, and at bedtime.  also check if you have symptoms of your blood sugar being too high or too low.  please keep a record of the readings and bring it to your next appointment here.  You can write it on any piece of paper.  please call us sooner if your blood sugar goes below 70, or if you have a lot of readings over 200.  Please come back for a follow-up appointment in 2-3 months.

## 2018-04-10 ENCOUNTER — Ambulatory Visit: Payer: BLUE CROSS/BLUE SHIELD | Admitting: Endocrinology

## 2018-05-07 ENCOUNTER — Other Ambulatory Visit: Payer: Self-pay | Admitting: Endocrinology

## 2018-05-07 ENCOUNTER — Ambulatory Visit (INDEPENDENT_AMBULATORY_CARE_PROVIDER_SITE_OTHER): Payer: BLUE CROSS/BLUE SHIELD | Admitting: Endocrinology

## 2018-05-07 ENCOUNTER — Encounter: Payer: Self-pay | Admitting: Endocrinology

## 2018-05-07 VITALS — BP 178/102 | HR 78 | Ht 73.0 in | Wt 258.0 lb

## 2018-05-07 DIAGNOSIS — E119 Type 2 diabetes mellitus without complications: Secondary | ICD-10-CM

## 2018-05-07 DIAGNOSIS — Z794 Long term (current) use of insulin: Secondary | ICD-10-CM | POA: Diagnosis not present

## 2018-05-07 LAB — POCT GLYCOSYLATED HEMOGLOBIN (HGB A1C): Hemoglobin A1C: 7.4 % — AB (ref 4.0–5.6)

## 2018-05-07 MED ORDER — INSULIN ASPART 100 UNIT/ML FLEXPEN: 40.0000 [IU] | PEN_INJECTOR | Freq: Every day | SUBCUTANEOUS | 11 refills | Status: DC

## 2018-05-07 MED ORDER — INSULIN GLARGINE 100 UNIT/ML SOLOSTAR PEN: 75.0000 [IU] | PEN_INJECTOR | SUBCUTANEOUS | 3 refills | Status: DC

## 2018-05-07 MED ORDER — INSULIN GLARGINE 100 UNIT/ML SOLOSTAR PEN
75.0000 [IU] | PEN_INJECTOR | SUBCUTANEOUS | 3 refills | Status: DC
Start: 2018-05-07 — End: 2018-05-07

## 2018-05-07 NOTE — Telephone Encounter (Signed)
75 is correct.  I have corrected and resent
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
## 2018-05-07 NOTE — Patient Instructions (Addendum)
Please change the insulins to the numbers listed below Your blood pressure is high today.  Please see your primary care provider soon, to have it rechecked If in the future you are unable to get insulin through insurance, you can buy [facility] type insulins for $25 per bottle On this type of insulin schedule, you should eat meals on a regular schedule.  If a meal is missed or significantly delayed, your blood sugar could go low.   check your blood sugar twice a day.  vary the time of day when you check, between before the 3 meals, and at bedtime.  also check if you have symptoms of your blood sugar being too high or too low.  please keep a record of the readings and bring it to your next appointment here.  You can write it on any piece of paper.  please call us sooner if your blood sugar goes below 70, or if you have a lot of readings over 200.  Please come back for a follow-up appointment in 3-4 months.

## 2018-05-08 NOTE — Telephone Encounter (Signed)
OK 

## 2018-05-08 NOTE — Telephone Encounter (Signed)
Is it ok to switch pt to Basaglar from Lantus? Pharmacy is asking

## 2018-07-24 ENCOUNTER — Other Ambulatory Visit: Payer: Self-pay | Admitting: Endocrinology

## 2018-08-13 ENCOUNTER — Encounter: Payer: Self-pay | Admitting: Endocrinology

## 2018-08-13 ENCOUNTER — Ambulatory Visit (INDEPENDENT_AMBULATORY_CARE_PROVIDER_SITE_OTHER): Payer: BLUE CROSS/BLUE SHIELD | Admitting: Endocrinology

## 2018-08-13 VITALS — BP 118/70 | HR 75 | Ht 73.0 in | Wt 257.8 lb

## 2018-08-13 DIAGNOSIS — E162 Hypoglycemia, unspecified: Secondary | ICD-10-CM | POA: Diagnosis not present

## 2018-08-13 DIAGNOSIS — Z794 Long term (current) use of insulin: Secondary | ICD-10-CM | POA: Diagnosis not present

## 2018-08-13 DIAGNOSIS — Z23 Encounter for immunization: Secondary | ICD-10-CM

## 2018-08-13 DIAGNOSIS — E119 Type 2 diabetes mellitus without complications: Secondary | ICD-10-CM

## 2018-08-13 LAB — POCT GLYCOSYLATED HEMOGLOBIN (HGB A1C): Hemoglobin A1C: 8.1 % — AB (ref 4.0–5.6)

## 2018-08-13 MED ORDER — BASAGLAR KWIKPEN 100 UNIT/ML ~~LOC~~ SOPN: 85.0000 [IU] | PEN_INJECTOR | SUBCUTANEOUS | 11 refills | Status: DC

## 2018-08-13 NOTE — Patient Instructions (Addendum)
Please increase the basaglar to 85 units on days off, and continue 75 units on work days.  Please continue the same novolog.  On this type of insulin schedule, you should eat meals on a regular schedule.  If a meal is missed or significantly delayed, your blood sugar could go low.   check your blood sugar twice a day.  vary the time of day when you check, between before the 3 meals, and at bedtime.  also check if you have symptoms of your blood sugar being too high or too low.  please keep a record of the readings and bring it to your next appointment here.  You can write it on any piece of paper.  please call us sooner if your blood sugar goes below 70, or if you have a lot of readings over 200.  Please come back for a follow-up appointment in 2 months.
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
## 2018-09-11 ENCOUNTER — Encounter: Payer: Self-pay | Admitting: Internal Medicine

## 2018-09-11 ENCOUNTER — Other Ambulatory Visit (INDEPENDENT_AMBULATORY_CARE_PROVIDER_SITE_OTHER): Payer: BLUE CROSS/BLUE SHIELD

## 2018-09-11 ENCOUNTER — Ambulatory Visit (INDEPENDENT_AMBULATORY_CARE_PROVIDER_SITE_OTHER): Payer: BLUE CROSS/BLUE SHIELD | Admitting: Internal Medicine

## 2018-09-11 VITALS — BP 136/84 | HR 71 | Temp 98.7°F | Ht 73.0 in | Wt 260.0 lb

## 2018-09-11 DIAGNOSIS — Z794 Long term (current) use of insulin: Secondary | ICD-10-CM

## 2018-09-11 DIAGNOSIS — E7800 Pure hypercholesterolemia, unspecified: Secondary | ICD-10-CM | POA: Diagnosis not present

## 2018-09-11 DIAGNOSIS — I10 Essential (primary) hypertension: Secondary | ICD-10-CM | POA: Diagnosis not present

## 2018-09-11 DIAGNOSIS — Z0000 Encounter for general adult medical examination without abnormal findings: Secondary | ICD-10-CM | POA: Diagnosis not present

## 2018-09-11 DIAGNOSIS — E119 Type 2 diabetes mellitus without complications: Secondary | ICD-10-CM

## 2018-09-11 DIAGNOSIS — R42 Dizziness and giddiness: Secondary | ICD-10-CM | POA: Diagnosis not present

## 2018-09-11 LAB — CBC WITH DIFFERENTIAL/PLATELET
BASOS ABS: 0 10*3/uL (ref 0.0–0.1)
Basophils Relative: 0.7 % (ref 0.0–3.0)
EOS ABS: 0.1 10*3/uL (ref 0.0–0.7)
Eosinophils Relative: 2.6 % (ref 0.0–5.0)
HCT: 41.8 % (ref 39.0–52.0)
Hemoglobin: 14.1 g/dL (ref 13.0–17.0)
Lymphocytes Relative: 31.6 % (ref 12.0–46.0)
Lymphs Abs: 1.7 10*3/uL (ref 0.7–4.0)
MCHC: 33.7 g/dL (ref 30.0–36.0)
MCV: 91.9 fl (ref 78.0–100.0)
Monocytes Absolute: 0.6 10*3/uL (ref 0.1–1.0)
Monocytes Relative: 11.5 % (ref 3.0–12.0)
Neutro Abs: 2.9 10*3/uL (ref 1.4–7.7)
Neutrophils Relative %: 53.6 % (ref 43.0–77.0)
Platelets: 271 10*3/uL (ref 150.0–400.0)
RBC: 4.54 Mil/uL (ref 4.22–5.81)
RDW: 12.6 % (ref 11.5–15.5)
WBC: 5.3 10*3/uL (ref 4.0–10.5)

## 2018-09-11 LAB — MICROALBUMIN / CREATININE URINE RATIO
Creatinine,U: 154.4 mg/dL
MICROALB UR: 1.6 mg/dL (ref 0.0–1.9)
Microalb Creat Ratio: 1 mg/g (ref 0.0–30.0)

## 2018-09-11 LAB — BASIC METABOLIC PANEL WITH GFR
BUN: 20 mg/dL (ref 6–23)
CO2: 29 meq/L (ref 19–32)
Calcium: 9.5 mg/dL (ref 8.4–10.5)
Chloride: 98 meq/L (ref 96–112)
Creatinine, Ser: 1.34 mg/dL (ref 0.40–1.50)
GFR: 70.82 mL/min
Glucose, Bld: 252 mg/dL — ABNORMAL HIGH (ref 70–99)
Potassium: 4.6 meq/L (ref 3.5–5.1)
Sodium: 134 meq/L — ABNORMAL LOW (ref 135–145)

## 2018-09-11 LAB — TSH: TSH: 1.16 u[IU]/mL (ref 0.35–4.50)

## 2018-09-11 LAB — URINALYSIS, ROUTINE W REFLEX MICROSCOPIC
BILIRUBIN URINE: NEGATIVE
Ketones, ur: NEGATIVE
Leukocytes, UA: NEGATIVE
Nitrite: NEGATIVE
Specific Gravity, Urine: 1.025 (ref 1.000–1.030)
Total Protein, Urine: NEGATIVE
Urine Glucose: 500 — AB
Urobilinogen, UA: 0.2 (ref 0.0–1.0)
pH: 5.5 (ref 5.0–8.0)

## 2018-09-11 LAB — HEPATIC FUNCTION PANEL
ALT: 26 U/L (ref 0–53)
AST: 21 U/L (ref 0–37)
Albumin: 4.2 g/dL (ref 3.5–5.2)
Alkaline Phosphatase: 65 U/L (ref 39–117)
BILIRUBIN DIRECT: 0.1 mg/dL (ref 0.0–0.3)
Total Bilirubin: 0.9 mg/dL (ref 0.2–1.2)
Total Protein: 7.4 g/dL (ref 6.0–8.3)

## 2018-09-11 LAB — LIPID PANEL
Cholesterol: 184 mg/dL (ref 0–200)
HDL: 32.8 mg/dL — ABNORMAL LOW
LDL Cholesterol: 112 mg/dL — ABNORMAL HIGH (ref 0–99)
NonHDL: 151.26
Total CHOL/HDL Ratio: 6
Triglycerides: 198 mg/dL — ABNORMAL HIGH (ref 0.0–149.0)
VLDL: 39.6 mg/dL (ref 0.0–40.0)

## 2018-09-11 LAB — PSA: PSA: 0.77 ng/mL (ref 0.10–4.00)

## 2018-09-11 MED ORDER — MECLIZINE HCL 12.5 MG PO TABS: 12.5000 mg | ORAL_TABLET | Freq: Three times a day (TID) | ORAL | 1 refills | Status: AC | PRN

## 2018-09-11 MED ORDER — ROSUVASTATIN CALCIUM 10 MG PO TABS: 10.0000 mg | ORAL_TABLET | Freq: Every day | ORAL | 3 refills | Status: DC

## 2018-09-11 NOTE — Assessment & Plan Note (Signed)

## 2018-09-11 NOTE — Assessment & Plan Note (Signed)
Positional off and on for weeks, ok for meclizine prn

## 2018-09-11 NOTE — Assessment & Plan Note (Signed)
stable overall by history and exam, recent data reviewed with pt, and pt to continue medical treatment as before,  to f/u any worsening symptoms or concerns  

## 2018-09-11 NOTE — Patient Instructions (Addendum)
You had the Pneumovax pneumonia shot today  Please take all new medication as prescribed - meclizine as needed for vertigo  OK to stop the pravastatin as you have  Please take all new medication as prescribed - the low dose crestor 10 mg per day, and the meclizine as needed for dizziness  Please continue all other medications as before, and refills have been done if requested.  Please have the pharmacy call with any other refills you may need.  Please continue your efforts at being more active, low cholesterol diet, and weight control.  You are otherwise up to date with prevention measures today.  Please keep your appointments with your specialists as you may have planned  Please go to the LAB in the Basement (turn left off the elevator) for the tests to be done today  You will be contacted by phone if any changes need to be made immediately.  Otherwise, you will receive a letter about your results with an explanation, but please check with MyChart first.  Please remember to sign up for MyChart if you have not done so, as this will be important to you in the future with finding out test results, communicating by private email, and scheduling acute appointments online when needed.  Please return in 1 year for your yearly visit, or sooner if needed, with Lab testing done 3-5 days before

## 2018-09-11 NOTE — Assessment & Plan Note (Signed)
Mild uncontrolled, for add crestor 10 qd

## 2018-09-11 NOTE — Assessment & Plan Note (Signed)
For f/u endo as planned
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
## 2018-10-16 ENCOUNTER — Encounter: Payer: Self-pay | Admitting: Endocrinology

## 2018-10-16 ENCOUNTER — Ambulatory Visit (INDEPENDENT_AMBULATORY_CARE_PROVIDER_SITE_OTHER): Payer: BLUE CROSS/BLUE SHIELD | Admitting: Endocrinology

## 2018-10-16 VITALS — BP 132/70 | HR 70 | Ht 73.0 in | Wt 261.4 lb

## 2018-10-16 DIAGNOSIS — Z794 Long term (current) use of insulin: Secondary | ICD-10-CM | POA: Diagnosis not present

## 2018-10-16 DIAGNOSIS — E119 Type 2 diabetes mellitus without complications: Secondary | ICD-10-CM

## 2018-10-16 LAB — POCT GLYCOSYLATED HEMOGLOBIN (HGB A1C): Hemoglobin A1C: 8.5 % — AB (ref 4.0–5.6)

## 2018-10-16 MED ORDER — BASAGLAR KWIKPEN 100 UNIT/ML ~~LOC~~ SOPN: 95.0000 [IU] | PEN_INJECTOR | SUBCUTANEOUS | 11 refills | Status: DC
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
## 2018-10-16 NOTE — Patient Instructions (Addendum)
Please increase the basaglar to 95 units on days off, and continue 75 units on work days.  Please continue the same novolog.  On this type of insulin schedule, you should eat meals on a regular schedule.  If a meal is missed or significantly delayed, your blood sugar could go low.   check your blood sugar twice a day.  vary the time of day when you check, between before the 3 meals, and at bedtime.  also check if you have symptoms of your blood sugar being too high or too low.  please keep a record of the readings and bring it to your next appointment here.  You can write it on any piece of paper.  please call us sooner if your blood sugar goes below 70, or if you have a lot of readings over 200.   Please come back for a follow-up appointment in 2 months.

## 2018-11-14 ENCOUNTER — Other Ambulatory Visit: Payer: Self-pay | Admitting: Internal Medicine

## 2018-12-01 ENCOUNTER — Other Ambulatory Visit: Payer: Self-pay | Admitting: Endocrinology

## 2018-12-24 ENCOUNTER — Ambulatory Visit (INDEPENDENT_AMBULATORY_CARE_PROVIDER_SITE_OTHER): Payer: BLUE CROSS/BLUE SHIELD | Admitting: Endocrinology

## 2018-12-24 ENCOUNTER — Other Ambulatory Visit: Payer: Self-pay

## 2018-12-24 DIAGNOSIS — Z794 Long term (current) use of insulin: Secondary | ICD-10-CM | POA: Diagnosis not present

## 2018-12-24 DIAGNOSIS — E119 Type 2 diabetes mellitus without complications: Secondary | ICD-10-CM | POA: Diagnosis not present

## 2018-12-24 DIAGNOSIS — E1122 Type 2 diabetes mellitus with diabetic chronic kidney disease: Secondary | ICD-10-CM | POA: Diagnosis not present

## 2018-12-24 DIAGNOSIS — N183 Chronic kidney disease, stage 3 (moderate): Secondary | ICD-10-CM | POA: Diagnosis not present
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
## 2018-12-25 ENCOUNTER — Other Ambulatory Visit: Payer: Self-pay | Admitting: Endocrinology

## 2018-12-27 MED ORDER — FREESTYLE LIBRE 14 DAY READER DEVI: 1.0000 | Freq: Once | 0 refills | Status: AC

## 2018-12-27 MED ORDER — INSULIN ASPART 100 UNIT/ML FLEXPEN: 45.0000 [IU] | PEN_INJECTOR | Freq: Every day | SUBCUTANEOUS | 11 refills | Status: DC

## 2018-12-27 MED ORDER — BASAGLAR KWIKPEN 100 UNIT/ML ~~LOC~~ SOPN: 95.0000 [IU] | PEN_INJECTOR | SUBCUTANEOUS | 3 refills | Status: DC

## 2018-12-27 MED ORDER — FREESTYLE LIBRE 14 DAY SENSOR MISC: 1.0000 | 3 refills | Status: DC

## 2019-03-23 ENCOUNTER — Telehealth: Payer: Self-pay | Admitting: Endocrinology

## 2019-03-23 NOTE — Telephone Encounter (Signed)
LOV 12/24/18. Per Dr. Loanne Drilling, f/u in 6 weeks. Called pt and informed of need to schedule an appt so we can further address his concerns. Left detailed VM asking pt to call and schedule an appt

## 2019-03-23 NOTE — Telephone Encounter (Signed)
Patient states his Novolog has just become to expensive. States they did call the insurance company and they did not have any recommendations on changes that he would have to call the doctor.  Please Advise, Thanks  Can leave detailed VM

## 2019-03-24 ENCOUNTER — Other Ambulatory Visit: Payer: Self-pay

## 2019-03-26 ENCOUNTER — Encounter: Payer: Self-pay | Admitting: Endocrinology

## 2019-03-26 ENCOUNTER — Ambulatory Visit: Payer: BC Managed Care – PPO | Admitting: Endocrinology

## 2019-03-26 ENCOUNTER — Other Ambulatory Visit: Payer: Self-pay

## 2019-03-26 VITALS — BP 142/70 | HR 78 | Ht 73.0 in | Wt 261.8 lb

## 2019-03-26 DIAGNOSIS — Z794 Long term (current) use of insulin: Secondary | ICD-10-CM | POA: Diagnosis not present

## 2019-03-26 DIAGNOSIS — E119 Type 2 diabetes mellitus without complications: Secondary | ICD-10-CM | POA: Diagnosis not present

## 2019-03-26 LAB — POCT GLYCOSYLATED HEMOGLOBIN (HGB A1C): Hemoglobin A1C: 8.7 % — AB (ref 4.0–5.6)

## 2019-03-26 MED ORDER — BASAGLAR KWIKPEN 100 UNIT/ML ~~LOC~~ SOPN: 130.0000 [IU] | PEN_INJECTOR | SUBCUTANEOUS | 3 refills | Status: DC
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
## 2019-03-26 NOTE — Patient Instructions (Addendum)
Your blood pressure is high today.  Please see your primary care provider soon, to have it rechecked Please increase the basaglar to 130 units every morning, and go off the novolog.  On this type of insulin schedule, you should eat meals on a regular schedule.  If a meal is missed or significantly delayed, your blood sugar could go low.   check your blood sugar twice a day.  vary the time of day when you check, between before the 3 meals, and at bedtime.  also check if you have symptoms of your blood sugar being too high or too low.  please keep a record of the readings and bring it to your next appointment here.  You can write it on any piece of paper.  please call us sooner if your blood sugar goes below 70, or if you have a lot of readings over 200.   Please come back for a follow-up appointment in 2 months.

## 2019-03-26 NOTE — Progress Notes (Signed)
ae

## 2019-04-06 ENCOUNTER — Other Ambulatory Visit: Payer: Self-pay

## 2019-04-06 ENCOUNTER — Encounter: Payer: Self-pay | Admitting: Family

## 2019-04-06 ENCOUNTER — Ambulatory Visit (INDEPENDENT_AMBULATORY_CARE_PROVIDER_SITE_OTHER): Payer: BC Managed Care – PPO | Admitting: Family

## 2019-04-06 DIAGNOSIS — K2900 Acute gastritis without bleeding: Secondary | ICD-10-CM

## 2019-04-06 MED ORDER — PANTOPRAZOLE SODIUM 40 MG PO TBEC: 40.0000 mg | DELAYED_RELEASE_TABLET | Freq: Two times a day (BID) | ORAL | 0 refills | Status: DC

## 2019-04-06 NOTE — Progress Notes (Signed)
William Barber is a 58 y.o. male with the following history as recorded in [employer]:  Patient Active Problem List   Diagnosis Date Noted  . 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 Vertigo 09/06/2017  . Erectile dysfunction 09/06/2017  . IBS (irritable bowel syndrome)   . Routine general medical examination at a health care facility 04/19/2016  . HTN (hypertension) 06/06/2012  . Hypercholesterolemia 06/06/2012  . Cancer of sigmoid colon, pT3pN0 (0/31 LN) 05/19/2012  . Liver mass on CT Sep2013 05/19/2012    Current Outpatient Medications  Medication Sig Dispense Refill  . aspirin 81 MG EC tablet Take 1 tablet (81 mg total) by mouth daily. Swallow whole. 30 tablet 12  . enalapril (VASOTEC) 20 MG tablet TAKE 1 TABLET BY MOUTH EVERY DAY IN THE MORNING 90 tablet 3  . Insulin Glargine (BASAGLAR KWIKPEN) 100 UNIT/ML SOPN Inject 1.3 mLs (130 Units total) into the skin every morning. 45 pen 3  . Insulin Pen Needle (BD PEN NEEDLE NANO U/F) 32G X 4 MM MISC USE AS DIRECTED 100 each 11  . meclizine (ANTIVERT) 12.5 MG tablet Take 1 tablet (12.5 mg total) by mouth 3 (three) times daily as needed for dizziness. 30 tablet 1  . pantoprazole (PROTONIX) 40 MG tablet Take 1 tablet (40 mg total) by mouth 2 (two) times daily before a meal. 30 tablet 0  . rosuvastatin (CRESTOR) 10 MG tablet Take 1 tablet (10 mg total) by mouth daily. 90 tablet 3   No current facility-administered medications for this visit.     Allergies: Hydromorphone hcl, Iodinated diagnostic agents, and Morphine and related  Past Medical History:  Diagnosis Date  . Colon cancer ([place]) 06/05/12 dx   invasive mod diff adenocarcinoma,invading muscularis propria into pericoloniic fatty tissue(0/31)lymh node neg  . Hypercholesterolemia   . Hypertension   . IBS (irritable bowel syndrome)   . IBS (irritable bowel syndrome)   . Liver mass on CT Sep2013 05/19/2012  . 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 Type 2 diabetes mellitus, uncontrolled  (HCC)    dx age 89  . Vertigo 09/06/2017    Past Surgical History:  Procedure Laterality Date  . COLONOSCOPY W/ BIOPSIES  05/09/12   mass distal sigmoid colon lesion bx'd  . KNEE SURGERY  1987   Left  . LAPAROSCOPIC INCISIONAL / UMBILICAL / VENTRAL HERNIA REPAIR  06/05/12   DR.Groat,  low anterior resection  . PROCTOSCOPY  06/05/2012   Procedure: PROCTOSCOPY;  Surgeon: Adin Hector, MD;  [facility];  Service: General;;  rigid proctoscopy   . UMBILICAL HERNIA REPAIR  06/05/2012   Procedure: HERNIA REPAIR UMBILICAL ADULT;  Surgeon: Adin Hector, MD;  Location: [facility];  Service: General;  Laterality: N/A;    Family History  Problem Relation Age of Onset  . Diabetes Mother   . Cancer Sister        cervical cancer  . Cancer Paternal Grandfather        colon cancer    Social History   Tobacco Use  . Smoking status: Former Smoker    Types: Cigarettes    Quit date: 06/25/1985    Years since quitting: 33.8  . Smokeless tobacco: Never Used  . Tobacco comment: 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 [place]  Substance Use Topics  . Alcohol use: Yes    Comment: beer occasionally    Subjective:    I connected with Lynnell Jude on 04/06/19 at 11:20 AM EDT by a video enabled telemedicine application and verified  that I am speaking with the correct person using two identifiers.   I discussed the limitations of evaluation and management by telemedicine and the availability of in person appointments. The patient expressed understanding and agreed to proceed.  Ate take out last Friday night and very soon after began to experience pain in his abdomen- localized above belly button and below ribs; was vomiting on Friday but no vomiting since Saturday afternoon; no fever, diarrhea; bowel movements have been normal; does still have gallbladder; history of colon cancer;     Objective:  There were no vitals filed for this visit.  General: Well developed, well nourished, in no acute distress  Head:  Normocephalic and atraumatic  Lungs: Respirations unlabored;  Neurologic: Alert and oriented; speech intact; face symmetrical;   Assessment:  1. Acute gastritis without hemorrhage, unspecified gastritis type     Plan:  ? Food borne illness; trial of Protonix 40 mg bid x 5 days; may need to consider abdominal ultrasound if symptoms persist; follow-up to be determined.   No follow-ups on file.  No orders of the defined types were placed in this encounter.   Requested Prescriptions   Signed Prescriptions Disp Refills  . pantoprazole (PROTONIX) 40 MG tablet 30 tablet 0    Sig: Take 1 tablet (40 mg total) by mouth 2 (two) times daily before a meal.

## 2019-04-13 ENCOUNTER — Other Ambulatory Visit: Payer: Self-pay | Admitting: Family

## 2019-05-29 ENCOUNTER — Ambulatory Visit: Payer: BC Managed Care – PPO | Admitting: Endocrinology

## 2019-06-11 ENCOUNTER — Other Ambulatory Visit: Payer: Self-pay

## 2019-06-15 ENCOUNTER — Other Ambulatory Visit: Payer: Self-pay

## 2019-06-15 ENCOUNTER — Ambulatory Visit (INDEPENDENT_AMBULATORY_CARE_PROVIDER_SITE_OTHER): Payer: BC Managed Care – PPO | Admitting: Endocrinology

## 2019-06-15 ENCOUNTER — Encounter: Payer: Self-pay | Admitting: Endocrinology

## 2019-06-15 VITALS — BP 124/60 | HR 75 | Ht 73.0 in | Wt 258.8 lb

## 2019-06-15 DIAGNOSIS — Z794 Long term (current) use of insulin: Secondary | ICD-10-CM | POA: Diagnosis not present

## 2019-06-15 DIAGNOSIS — E119 Type 2 diabetes mellitus without complications: Secondary | ICD-10-CM

## 2019-06-15 LAB — POCT GLYCOSYLATED HEMOGLOBIN (HGB A1C): Hemoglobin A1C: 8 % — AB (ref 4.0–5.6)

## 2019-06-15 MED ORDER — BASAGLAR KWIKPEN 100 UNIT/ML ~~LOC~~ SOPN: 140.0000 [IU] | PEN_INJECTOR | SUBCUTANEOUS | 3 refills | Status: DC
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
## 2019-06-15 NOTE — Patient Instructions (Addendum)
Please increase the basaglar to 140 units every morning.   On this type of insulin schedule, you should eat meals on a regular schedule.  If a meal is missed or significantly delayed, your blood sugar could go low.   check your blood sugar twice a day.  vary the time of day when you check, between before the 3 meals, and at bedtime.  also check if you have symptoms of your blood sugar being too high or too low.  please keep a record of the readings and bring it to your next appointment here.  You can write it on any piece of paper.  please call us sooner if your blood sugar goes below 70, or if you have a lot of readings over 200.   Please come back for a follow-up appointment in 2 months.

## 2019-08-13 ENCOUNTER — Other Ambulatory Visit: Payer: Self-pay

## 2019-08-17 ENCOUNTER — Ambulatory Visit: Payer: BC Managed Care – PPO | Admitting: Endocrinology

## 2019-09-15 ENCOUNTER — Ambulatory Visit (INDEPENDENT_AMBULATORY_CARE_PROVIDER_SITE_OTHER): Payer: BC Managed Care – PPO | Admitting: Internal Medicine

## 2019-09-15 ENCOUNTER — Other Ambulatory Visit: Payer: Self-pay | Admitting: Internal Medicine

## 2019-09-15 ENCOUNTER — Encounter: Payer: Self-pay | Admitting: Internal Medicine

## 2019-09-15 ENCOUNTER — Other Ambulatory Visit: Payer: Self-pay

## 2019-09-15 VITALS — BP 160/88 | HR 98 | Temp 98.9°F | Resp 12 | Ht 71.0 in | Wt 259.0 lb

## 2019-09-15 DIAGNOSIS — E559 Vitamin D deficiency, unspecified: Secondary | ICD-10-CM

## 2019-09-15 DIAGNOSIS — E611 Iron deficiency: Secondary | ICD-10-CM

## 2019-09-15 DIAGNOSIS — E1122 Type 2 diabetes mellitus with diabetic chronic kidney disease: Secondary | ICD-10-CM | POA: Diagnosis not present

## 2019-09-15 DIAGNOSIS — E538 Deficiency of other specified B group vitamins: Secondary | ICD-10-CM

## 2019-09-15 DIAGNOSIS — Z794 Long term (current) use of insulin: Secondary | ICD-10-CM | POA: Diagnosis not present

## 2019-09-15 DIAGNOSIS — Z0000 Encounter for general adult medical examination without abnormal findings: Secondary | ICD-10-CM | POA: Diagnosis not present

## 2019-09-15 DIAGNOSIS — I10 Essential (primary) hypertension: Secondary | ICD-10-CM | POA: Diagnosis not present

## 2019-09-15 DIAGNOSIS — Z23 Encounter for immunization: Secondary | ICD-10-CM

## 2019-09-15 DIAGNOSIS — N183 Chronic kidney disease, stage 3 unspecified: Secondary | ICD-10-CM | POA: Diagnosis not present

## 2019-09-15 LAB — MICROALBUMIN / CREATININE URINE RATIO
Creatinine,U: 118 mg/dL
Microalb Creat Ratio: 6.7 mg/g (ref 0.0–30.0)
Microalb, Ur: 7.9 mg/dL — ABNORMAL HIGH (ref 0.0–1.9)

## 2019-09-15 LAB — IBC PANEL
Iron: 95 ug/dL (ref 42–165)
Saturation Ratios: 28.2 % (ref 20.0–50.0)
Transferrin: 241 mg/dL (ref 212.0–360.0)

## 2019-09-15 LAB — CBC WITH DIFFERENTIAL/PLATELET
Basophils Absolute: 0.1 10*3/uL (ref 0.0–0.1)
Basophils Relative: 1 % (ref 0.0–3.0)
Eosinophils Absolute: 0.1 10*3/uL (ref 0.0–0.7)
Eosinophils Relative: 1.9 % (ref 0.0–5.0)
HCT: 43.1 % (ref 39.0–52.0)
Hemoglobin: 14.6 g/dL (ref 13.0–17.0)
Lymphocytes Relative: 30.8 % (ref 12.0–46.0)
Lymphs Abs: 1.7 10*3/uL (ref 0.7–4.0)
MCHC: 33.7 g/dL (ref 30.0–36.0)
MCV: 93.1 fl (ref 78.0–100.0)
Monocytes Absolute: 0.6 10*3/uL (ref 0.1–1.0)
Monocytes Relative: 10.2 % (ref 3.0–12.0)
Neutro Abs: 3.1 10*3/uL (ref 1.4–7.7)
Neutrophils Relative %: 56.1 % (ref 43.0–77.0)
Platelets: 292 10*3/uL (ref 150.0–400.0)
RBC: 4.63 Mil/uL (ref 4.22–5.81)
RDW: 12.4 % (ref 11.5–15.5)
WBC: 5.6 10*3/uL (ref 4.0–10.5)

## 2019-09-15 LAB — BASIC METABOLIC PANEL
BUN: 16 mg/dL (ref 6–23)
CO2: 29 mEq/L (ref 19–32)
Calcium: 9.8 mg/dL (ref 8.4–10.5)
Chloride: 100 mEq/L (ref 96–112)
Creatinine, Ser: 1.2 mg/dL (ref 0.40–1.50)
GFR: 75.41 mL/min (ref >60.00)
Glucose, Bld: 236 mg/dL — ABNORMAL HIGH (ref 70–99)
Potassium: 4.6 mEq/L (ref 3.5–5.1)
Sodium: 135 mEq/L (ref 135–145)

## 2019-09-15 LAB — HEPATIC FUNCTION PANEL
ALT: 45 U/L (ref 0–53)
AST: 30 U/L (ref 0–37)
Albumin: 4.5 g/dL (ref 3.5–5.2)
Alkaline Phosphatase: 68 U/L (ref 39–117)
Bilirubin, Direct: 0.2 mg/dL (ref 0.0–0.3)
Total Bilirubin: 0.7 mg/dL (ref 0.2–1.2)
Total Protein: 8 g/dL (ref 6.0–8.3)

## 2019-09-15 LAB — URINALYSIS, ROUTINE W REFLEX MICROSCOPIC
Bilirubin Urine: NEGATIVE
Hgb urine dipstick: NEGATIVE
Ketones, ur: NEGATIVE
Leukocytes,Ua: NEGATIVE
Nitrite: NEGATIVE
RBC / HPF: NONE SEEN (ref 0–?)
Specific Gravity, Urine: 1.025 (ref 1.000–1.030)
Urine Glucose: >=1000 — AB
Urobilinogen, UA: 1 (ref 0.0–1.0)
WBC, UA: NONE SEEN (ref 0–?)
pH: 6 (ref 5.0–8.0)

## 2019-09-15 LAB — PSA: PSA: 1 ng/mL (ref 0.10–4.00)

## 2019-09-15 LAB — LIPID PANEL
Cholesterol: 135 mg/dL (ref 0–200)
HDL: 35.9 mg/dL — ABNORMAL LOW (ref >39.00)
LDL Cholesterol: 77 mg/dL (ref 0–99)
NonHDL: 98.92
Total CHOL/HDL Ratio: 4
Triglycerides: 111 mg/dL (ref 0.0–149.0)
VLDL: 22.2 mg/dL (ref 0.0–40.0)

## 2019-09-15 LAB — VITAMIN B12: Vitamin B-12: 757 pg/mL (ref 211–911)

## 2019-09-15 LAB — TSH: TSH: 1.47 u[IU]/mL (ref 0.35–4.50)

## 2019-09-15 LAB — VITAMIN D 25 HYDROXY (VIT D DEFICIENCY, FRACTURES): VITD: 21.55 ng/mL — ABNORMAL LOW (ref 30.00–100.00)

## 2019-09-15 MED ORDER — ENALAPRIL MALEATE 20 MG PO TABS: ORAL_TABLET | ORAL | 3 refills | Status: DC

## 2019-09-15 MED ORDER — ROSUVASTATIN CALCIUM 10 MG PO TABS: 10.0000 mg | ORAL_TABLET | Freq: Every day | ORAL | 3 refills | Status: DC

## 2019-09-15 MED ORDER — VITAMIN D (ERGOCALCIFEROL) 1.25 MG (50000 UNIT) PO CAPS: 50000.0000 [IU] | ORAL_CAPSULE | ORAL | 0 refills | Status: DC

## 2019-09-15 MED ORDER — PANTOPRAZOLE SODIUM 40 MG PO TBEC: 40.0000 mg | DELAYED_RELEASE_TABLET | Freq: Two times a day (BID) | ORAL | 3 refills | Status: DC

## 2019-09-15 NOTE — Addendum Note (Signed)
Addended by: Raliegh Ip on: 09/15/2019 09:13 AM   Modules accepted: Orders
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
## 2019-09-15 NOTE — Assessment & Plan Note (Signed)
To f/u next wk with endo as planned

## 2019-09-15 NOTE — Assessment & Plan Note (Signed)
To check BP daily at home and call with results

## 2019-09-15 NOTE — Assessment & Plan Note (Signed)

## 2019-09-15 NOTE — Patient Instructions (Addendum)
You had the Pneumovax pneumonia shot and the Flu shot today  Please check BP at home on regular basis with goal being at least less than 140/90  Please continue all other medications as before, and refills have been done if requested.  Please have the pharmacy call with any other refills you may need.  Please continue your efforts at being more active, low cholesterol diet, and weight control.  You are otherwise up to date with prevention measures today.  Please keep your appointments with your specialists as you may have planned - endo  Please go to the LAB at the blood drawing area for the tests to be done  You will be contacted by phone if any changes need to be made immediately.  Otherwise, you will receive a letter about your results with an explanation, but please check with MyChart first.  Please remember to sign up for MyChart if you have not done so, as this will be important to you in the future with finding out test results, communicating by private email, and scheduling acute appointments online when needed.  Please return in 1 year for your yearly visit, or sooner if needed, with Lab testing done 3-5 days before at the [facility] - please make appt as you leave today for this

## 2019-09-15 NOTE — Addendum Note (Signed)
Addended by: Elta Guadeloupe on: 09/15/2019 09:11 AM   Modules accepted: Orders

## 2019-09-22 ENCOUNTER — Other Ambulatory Visit: Payer: Self-pay

## 2019-09-24 ENCOUNTER — Other Ambulatory Visit: Payer: Self-pay

## 2019-09-24 ENCOUNTER — Ambulatory Visit: Payer: BC Managed Care – PPO | Admitting: Endocrinology

## 2019-09-24 ENCOUNTER — Encounter: Payer: Self-pay | Admitting: Endocrinology

## 2019-09-24 VITALS — BP 144/72 | HR 69 | Ht 71.0 in | Wt 264.4 lb

## 2019-09-24 DIAGNOSIS — Z794 Long term (current) use of insulin: Secondary | ICD-10-CM | POA: Diagnosis not present

## 2019-09-24 DIAGNOSIS — N183 Chronic kidney disease, stage 3 unspecified: Secondary | ICD-10-CM | POA: Diagnosis not present

## 2019-09-24 DIAGNOSIS — E1122 Type 2 diabetes mellitus with diabetic chronic kidney disease: Secondary | ICD-10-CM

## 2019-09-24 LAB — POCT GLYCOSYLATED HEMOGLOBIN (HGB A1C): Hemoglobin A1C: 9 % — AB (ref 4.0–5.6)

## 2019-09-24 MED ORDER — OZEMPIC (0.25 OR 0.5 MG/DOSE) 2 MG/1.5ML ~~LOC~~ SOPN: 0.2500 mg | PEN_INJECTOR | SUBCUTANEOUS | 11 refills | Status: DC
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
## 2019-09-24 NOTE — Patient Instructions (Addendum)
Your blood pressure is high today.  Please see your primary care provider soon, to have it rechecked Please increase the basaglar to 140 units every morning.   I have sent a prescription to your pharmacy, to add "Ozempic."  On this type of insulin schedule, you should eat meals on a regular schedule.  If a meal is missed or significantly delayed, your blood sugar could go low.   check your blood sugar twice a day.  vary the time of day when you check, between before the 3 meals, and at bedtime.  also check if you have symptoms of your blood sugar being too high or too low.  please keep a record of the readings and bring it to your next appointment here.  You can write it on any piece of paper.  please call us sooner if your blood sugar goes below 70, or if you have a lot of readings over 200.   Please come back for a follow-up appointment in 2 months.

## 2019-10-08 ENCOUNTER — Telehealth: Payer: Self-pay

## 2019-10-08 NOTE — Telephone Encounter (Signed)
Patient called today to report blood sugar running and would like advise on any changes to medication he may need to [facility] contact him at [phone] to get more information

## 2019-10-08 NOTE — Telephone Encounter (Signed)
Called pt for clarification. States he took his Ozempic Sunday but his CBG's had been running low regardless. Range 41-200 throughout the day. Given his lows, he is now checking CBG's 4-5x's/day. Wants to know if he can reduce his Basaglar.

## 2019-10-08 NOTE — Telephone Encounter (Signed)
Called pt and informed about new orders. Verbalized acceptance and understanding. 

## 2019-10-08 NOTE — Telephone Encounter (Signed)
Please reduce to 130 units qam

## 2019-10-26 ENCOUNTER — Other Ambulatory Visit: Payer: Self-pay | Admitting: Endocrinology

## 2019-10-27 ENCOUNTER — Telehealth: Payer: Self-pay

## 2019-10-27 ENCOUNTER — Other Ambulatory Visit: Payer: Self-pay

## 2019-10-27 MED ORDER — BASAGLAR KWIKPEN 100 UNIT/ML ~~LOC~~ SOPN: 130.0000 [IU] | PEN_INJECTOR | SUBCUTANEOUS | 0 refills | Status: DC

## 2019-10-27 NOTE — Telephone Encounter (Signed)
MEDICATION: basaglar  PHARMACY:   [id number] - [place], [place] - [address]  Additional Information            IS THIS A 90 DAY SUPPLY : yes  IS PATIENT OUT OF MEDICATION: yes  IF NOT; HOW MUCH IS LEFT:   LAST APPOINTMENT DATE: @2 /15/2021  NEXT APPOINTMENT DATE:@3 /18/2021  DO WE HAVE YOUR PERMISSION TO LEAVE A DETAILED MESSAGE:  OTHER COMMENTS: call came through [facility] states there are no refills for this medication and the patient is out- no current symptoms to report   **Let patient know to contact pharmacy at the end of the day to make sure medication is ready. **  ** Please notify patient to allow 48-72 hours to process**  **Encourage patient to contact the pharmacy for refills or they can request refills through [facility]**

## 2019-10-27 NOTE — Telephone Encounter (Signed)
Outpatient Medication Detail   Disp Refills Start End   Insulin Glargine (BASAGLAR KWIKPEN) 100 UNIT/ML SOPN 76 mL 0 10/27/2019    Sig - Route: Inject 0.85 mLs (85 Units total) into the skin every morning. - Subcutaneous   Sent to pharmacy as: Insulin Glargine (BASAGLAR KWIKPEN) 100 UNIT/ML Solution Pen-injector   E-Prescribing Status: Receipt confirmed by pharmacy (10/27/2019  7:16 AM EST)    Above Rx was sent today as indiated above

## 2019-11-24 ENCOUNTER — Other Ambulatory Visit: Payer: Self-pay

## 2019-11-26 ENCOUNTER — Encounter: Payer: Self-pay | Admitting: Endocrinology

## 2019-11-26 ENCOUNTER — Other Ambulatory Visit: Payer: Self-pay

## 2019-11-26 ENCOUNTER — Ambulatory Visit: Payer: BC Managed Care – PPO | Admitting: Endocrinology

## 2019-11-26 VITALS — BP 140/70 | HR 68 | Ht 71.0 in | Wt 264.6 lb

## 2019-11-26 DIAGNOSIS — Z794 Long term (current) use of insulin: Secondary | ICD-10-CM | POA: Diagnosis not present

## 2019-11-26 DIAGNOSIS — N183 Chronic kidney disease, stage 3 unspecified: Secondary | ICD-10-CM | POA: Diagnosis not present

## 2019-11-26 DIAGNOSIS — E1122 Type 2 diabetes mellitus with diabetic chronic kidney disease: Secondary | ICD-10-CM | POA: Diagnosis not present

## 2019-11-26 LAB — POCT GLYCOSYLATED HEMOGLOBIN (HGB A1C): Hemoglobin A1C: 7.1 % — AB (ref 4.0–5.6)

## 2019-11-26 MED ORDER — BASAGLAR KWIKPEN 100 UNIT/ML ~~LOC~~ SOPN: 100.0000 [IU] | PEN_INJECTOR | SUBCUTANEOUS | 3 refills | Status: DC

## 2019-11-26 MED ORDER — OZEMPIC (0.25 OR 0.5 MG/DOSE) 2 MG/1.5ML ~~LOC~~ SOPN: 0.5000 mg | PEN_INJECTOR | SUBCUTANEOUS | 3 refills | Status: DC
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
## 2019-11-26 NOTE — Patient Instructions (Addendum)
Your blood pressure is high today.  Please see your primary care provider soon, to have it rechecked Please decrease the basaglar to 100 units every morning.   I have sent a prescription to your pharmacy, to increase the Ozempic.  On this type of insulin schedule, you should eat meals on a regular schedule.  If a meal is missed or significantly delayed, your blood sugar could go low.   check your blood sugar twice a day.  vary the time of day when you check, between before the 3 meals, and at bedtime.  also check if you have symptoms of your blood sugar being too high or too low.  please keep a record of the readings and bring it to your next appointment here.  You can write it on any piece of paper.  please call us sooner if your blood sugar goes below 70, or if you have a lot of readings over 200.   Please come back for a follow-up appointment in 2 months.

## 2019-12-01 ENCOUNTER — Telehealth: Payer: Self-pay | Admitting: Internal Medicine

## 2019-12-01 NOTE — Telephone Encounter (Signed)
New Message:   Pt is calling to see what the next steps are now that he has finished the Vitamin D pills. Please advise.

## 2019-12-02 NOTE — Telephone Encounter (Signed)
Left message for patient to switch to OTC Vitamin D3 2,000 iu daily indefinitely per Dr. Jenny Reichmann.

## 2019-12-04 ENCOUNTER — Other Ambulatory Visit: Payer: Self-pay | Admitting: Internal Medicine

## 2019-12-04 NOTE — Telephone Encounter (Signed)
Please ask pt to change to OTC Vitamin D3 at 2000 units per day, indefinitely.  

## 2019-12-05 ENCOUNTER — Ambulatory Visit: Payer: BC Managed Care – PPO | Attending: Internal Medicine

## 2019-12-05 DIAGNOSIS — Z23 Encounter for immunization: Secondary | ICD-10-CM

## 2019-12-05 NOTE — Progress Notes (Signed)
   Covid-19 Vaccination Clinic  Name:  William Barber    MRN: [id number] DOB: 1960/10/08  12/05/2019  [name] was observed post Covid-19 immunization for 15 minutes without incident. He was provided with Vaccine Information Sheet and instruction to access the V-Safe system.   [name] was instructed to call 911 with any severe reactions post vaccine: Marland Kitchen Difficulty breathing  . Swelling of face and throat  . A fast heartbeat  . A bad rash all over body  . Dizziness and weakness   Immunizations Administered    Name Date Dose VIS Date Route   Pfizer COVID-19 Vaccine 12/05/2019  3:33 PM 0.3 mL 08/21/2019 Intramuscular   Manufacturer: [place]   Lot: [id number]   [place]: [id number]

## 2019-12-30 ENCOUNTER — Ambulatory Visit: Payer: BC Managed Care – PPO | Attending: Internal Medicine

## 2019-12-30 DIAGNOSIS — Z23 Encounter for immunization: Secondary | ICD-10-CM

## 2019-12-30 NOTE — Progress Notes (Signed)
   Covid-19 Vaccination Clinic  Name:  William Barber    MRN: [id number] DOB: April 24, 1961  12/30/2019  [name] was observed post Covid-19 immunization for 15 minutes without incident. He was provided with Vaccine Information Sheet and instruction to access the V-Safe system.   [name] was instructed to call 911 with any severe reactions post vaccine: Marland Kitchen Difficulty breathing  . Swelling of face and throat  . A fast heartbeat  . A bad rash all over body  . Dizziness and weakness   Immunizations Administered    Name Date Dose VIS Date Route   Pfizer COVID-19 Vaccine 12/30/2019  8:36 AM 0.3 mL 11/04/2018 Intramuscular   Manufacturer: [place]   Lot: [id number]   [place]: [id number]

## 2020-01-27 ENCOUNTER — Other Ambulatory Visit: Payer: Self-pay

## 2020-01-29 ENCOUNTER — Ambulatory Visit: Payer: BC Managed Care – PPO | Admitting: Endocrinology

## 2020-01-29 ENCOUNTER — Encounter: Payer: Self-pay | Admitting: Endocrinology

## 2020-01-29 ENCOUNTER — Other Ambulatory Visit: Payer: Self-pay

## 2020-01-29 VITALS — BP 150/76 | HR 68 | Ht 71.0 in | Wt 256.0 lb

## 2020-01-29 DIAGNOSIS — E1122 Type 2 diabetes mellitus with diabetic chronic kidney disease: Secondary | ICD-10-CM

## 2020-01-29 DIAGNOSIS — Z794 Long term (current) use of insulin: Secondary | ICD-10-CM

## 2020-01-29 DIAGNOSIS — N183 Chronic kidney disease, stage 3 unspecified: Secondary | ICD-10-CM | POA: Diagnosis not present

## 2020-01-29 LAB — POCT GLYCOSYLATED HEMOGLOBIN (HGB A1C): Hemoglobin A1C: 9 % — AB (ref 4.0–5.6)

## 2020-01-29 MED ORDER — OZEMPIC (1 MG/DOSE) 2 MG/1.5ML ~~LOC~~ SOPN: 1.0000 mg | PEN_INJECTOR | SUBCUTANEOUS | 3 refills | Status: DC

## 2020-01-29 NOTE — Patient Instructions (Addendum)
Your blood pressure is high today.  Please see your primary care provider soon, to have it rechecked Please continue the same basaglar.   I have sent a prescription to your pharmacy, to increase the Ozempic.  On this type of insulin schedule, you should eat meals on a regular schedule.  If a meal is missed or significantly delayed, your blood sugar could go low.   check your blood sugar twice a day.  vary the time of day when you check, between before the 3 meals, and at bedtime.  also check if you have symptoms of your blood sugar being too high or too low.  please keep a record of the readings and bring it to your next appointment here.  You can write it on any piece of paper.  please call us sooner if your blood sugar goes below 70, or if you have a lot of readings over 200.   Please come back for a follow-up appointment in 2 months.
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
## 2020-04-05 ENCOUNTER — Encounter: Payer: Self-pay | Admitting: Endocrinology

## 2020-04-05 ENCOUNTER — Ambulatory Visit: Payer: BC Managed Care – PPO | Admitting: Endocrinology

## 2020-04-05 ENCOUNTER — Other Ambulatory Visit: Payer: Self-pay

## 2020-04-05 VITALS — BP 132/80 | HR 72 | Ht 71.0 in | Wt 258.6 lb

## 2020-04-05 DIAGNOSIS — E1122 Type 2 diabetes mellitus with diabetic chronic kidney disease: Secondary | ICD-10-CM | POA: Diagnosis not present

## 2020-04-05 DIAGNOSIS — N183 Chronic kidney disease, stage 3 unspecified: Secondary | ICD-10-CM

## 2020-04-05 DIAGNOSIS — Z794 Long term (current) use of insulin: Secondary | ICD-10-CM | POA: Diagnosis not present

## 2020-04-05 LAB — POCT GLYCOSYLATED HEMOGLOBIN (HGB A1C): Hemoglobin A1C: 7.6 % — AB (ref 4.0–5.6)

## 2020-04-05 NOTE — Patient Instructions (Addendum)
Please continue the same medications On this type of insulin schedule, you should eat meals on a regular schedule.  If a meal is missed or significantly delayed, your blood sugar could go low.   check your blood sugar twice a day.  vary the time of day when you check, between before the 3 meals, and at bedtime.  also check if you have symptoms of your blood sugar being too high or too low.  please keep a record of the readings and bring it to your next appointment here.  You can write it on any piece of paper.  please call us sooner if your blood sugar goes below 70, or if you have a lot of readings over 200.   Please come back for a follow-up appointment in 3 months.
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
## 2020-04-24 ENCOUNTER — Other Ambulatory Visit: Payer: Self-pay | Admitting: Endocrinology

## 2020-05-12 ENCOUNTER — Telehealth: Payer: Self-pay | Admitting: Endocrinology

## 2020-05-12 NOTE — Telephone Encounter (Signed)
Patient called re: Patient requests a new RX with corrected dosage for  Insulin Glargine (BASAGLAR KWIKPEN) 100 UNIT/ML (Patient states he uses 100 units per day=6-7 boxes-current RX per [facility] was for 100 units per week) for a 3 month supply be sent to:  [id number] - [place], [place] Phone:  [phone]  Fax:  [phone]

## 2020-05-13 ENCOUNTER — Other Ambulatory Visit: Payer: Self-pay

## 2020-05-13 DIAGNOSIS — Z794 Long term (current) use of insulin: Secondary | ICD-10-CM

## 2020-05-13 MED ORDER — BASAGLAR KWIKPEN 100 UNIT/ML ~~LOC~~ SOPN: 100.0000 [IU] | PEN_INJECTOR | SUBCUTANEOUS | 3 refills | Status: DC

## 2020-05-13 NOTE — Telephone Encounter (Signed)
Corrected and sent

## 2020-07-08 ENCOUNTER — Ambulatory Visit: Payer: BC Managed Care – PPO | Admitting: Endocrinology

## 2020-07-08 ENCOUNTER — Encounter: Payer: Self-pay | Admitting: Endocrinology

## 2020-07-08 ENCOUNTER — Other Ambulatory Visit: Payer: Self-pay

## 2020-07-08 VITALS — BP 138/88 | HR 64 | Ht 71.0 in | Wt 251.2 lb

## 2020-07-08 DIAGNOSIS — N183 Chronic kidney disease, stage 3 unspecified: Secondary | ICD-10-CM

## 2020-07-08 DIAGNOSIS — Z794 Long term (current) use of insulin: Secondary | ICD-10-CM

## 2020-07-08 DIAGNOSIS — E1122 Type 2 diabetes mellitus with diabetic chronic kidney disease: Secondary | ICD-10-CM

## 2020-07-08 LAB — POCT GLYCOSYLATED HEMOGLOBIN (HGB A1C): Hemoglobin A1C: 7.7 % — AB (ref 4.0–5.6)
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
## 2020-07-08 NOTE — Patient Instructions (Addendum)
Please continue the same [place].  On this type of insulin schedule, you should eat meals on a regular schedule.  If a meal is missed or significantly delayed, your blood sugar could go low.   check your blood sugar twice a day.  vary the time of day when you check, between before the 3 meals, and at bedtime.  also check if you have symptoms of your blood sugar being too high or too low.  please keep a record of the readings and bring it to your next appointment here.  You can write it on any piece of paper.  please call us sooner if your blood sugar goes below 70, or if you have a lot of readings over 200.   Please come back for a follow-up appointment in 3 months.

## 2020-09-23 ENCOUNTER — Other Ambulatory Visit: Payer: Self-pay | Admitting: Internal Medicine

## 2020-10-06 ENCOUNTER — Other Ambulatory Visit: Payer: Self-pay | Admitting: Internal Medicine

## 2020-10-06 NOTE — Telephone Encounter (Signed)
Please refill as per office routine med refill policy (all routine meds refilled for 3 mo or monthly per pt preference up to one year from last visit, then month to month grace period for 3 mo, then further med refills will have to be denied)  

## 2020-10-06 NOTE — Telephone Encounter (Signed)
Refill enalipril for 30 day attempted to reach pt to inform them that they need an office visit....phone constantly rang.

## 2020-10-10 ENCOUNTER — Other Ambulatory Visit: Payer: Self-pay | Admitting: Endocrinology

## 2020-10-13 ENCOUNTER — Ambulatory Visit: Payer: BC Managed Care – PPO | Admitting: Endocrinology

## 2020-11-01 ENCOUNTER — Other Ambulatory Visit: Payer: Self-pay | Admitting: Internal Medicine

## 2020-11-01 NOTE — Telephone Encounter (Signed)
Please refill as per office routine med refill policy (all routine meds refilled for 3 mo or monthly per pt preference up to one year from last visit, then month to month grace period for 3 mo, then further med refills will have to be denied)  

## 2020-11-02 ENCOUNTER — Telehealth: Payer: Self-pay | Admitting: Internal Medicine

## 2020-11-02 DIAGNOSIS — E559 Vitamin D deficiency, unspecified: Secondary | ICD-10-CM

## 2020-11-02 DIAGNOSIS — E538 Deficiency of other specified B group vitamins: Secondary | ICD-10-CM

## 2020-11-02 DIAGNOSIS — Z0000 Encounter for general adult medical examination without abnormal findings: Secondary | ICD-10-CM

## 2020-11-02 DIAGNOSIS — Z794 Long term (current) use of insulin: Secondary | ICD-10-CM

## 2020-11-02 NOTE — Telephone Encounter (Signed)
Ok lab ordered 

## 2020-11-02 NOTE — Telephone Encounter (Signed)
   Patient requesting order  for annual labs 

## 2020-11-08 ENCOUNTER — Other Ambulatory Visit: Payer: Self-pay

## 2020-11-10 ENCOUNTER — Other Ambulatory Visit: Payer: Self-pay

## 2020-11-10 ENCOUNTER — Ambulatory Visit (INDEPENDENT_AMBULATORY_CARE_PROVIDER_SITE_OTHER): Payer: BC Managed Care – PPO | Admitting: Endocrinology

## 2020-11-10 VITALS — BP 150/82 | HR 68 | Ht 72.0 in | Wt 255.2 lb

## 2020-11-10 DIAGNOSIS — E1122 Type 2 diabetes mellitus with diabetic chronic kidney disease: Secondary | ICD-10-CM

## 2020-11-10 DIAGNOSIS — N183 Chronic kidney disease, stage 3 unspecified: Secondary | ICD-10-CM

## 2020-11-10 DIAGNOSIS — Z794 Long term (current) use of insulin: Secondary | ICD-10-CM

## 2020-11-10 LAB — POCT GLYCOSYLATED HEMOGLOBIN (HGB A1C): Hemoglobin A1C: 9.1 % — AB (ref 4.0–5.6)

## 2020-11-10 MED ORDER — BASAGLAR KWIKPEN 100 UNIT/ML ~~LOC~~ SOPN
130.0000 [IU] | PEN_INJECTOR | SUBCUTANEOUS | 3 refills | Status: DC
Start: 2020-11-10 — End: 2021-05-24

## 2020-11-10 MED ORDER — ONETOUCH VERIO VI STRP: 1.0000 | ORAL_STRIP | Freq: Two times a day (BID) | 3 refills | Status: DC
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
## 2020-11-10 NOTE — Patient Instructions (Addendum)
Your blood pressure is high today.  Please see your primary care provider soon, to have it rechecked Please increase the Basaglar to 130 units each morning, and continue the same Ozempic.  On this type of insulin schedule, you should eat meals on a regular schedule.  If a meal is missed or significantly delayed, your blood sugar could go low.    check your blood sugar twice a day.  vary the time of day when you check, between before the 3 meals, and at bedtime.  also check if you have symptoms of your blood sugar being too high or too low.  please keep a record of the readings and bring it to your next appointment here.  You can write it on any piece of paper.  please call us sooner if your blood sugar goes below 70, or if you have a lot of readings over 200.   Here is a new meter. I have sent a prescription to your pharmacy, for strips.   Please come back for a follow-up appointment in 3 months.

## 2020-11-25 ENCOUNTER — Other Ambulatory Visit (INDEPENDENT_AMBULATORY_CARE_PROVIDER_SITE_OTHER): Payer: BC Managed Care – PPO

## 2020-11-25 DIAGNOSIS — Z794 Long term (current) use of insulin: Secondary | ICD-10-CM

## 2020-11-25 DIAGNOSIS — Z0000 Encounter for general adult medical examination without abnormal findings: Secondary | ICD-10-CM

## 2020-11-25 DIAGNOSIS — E1122 Type 2 diabetes mellitus with diabetic chronic kidney disease: Secondary | ICD-10-CM

## 2020-11-25 DIAGNOSIS — E538 Deficiency of other specified B group vitamins: Secondary | ICD-10-CM | POA: Diagnosis not present

## 2020-11-25 DIAGNOSIS — N183 Chronic kidney disease, stage 3 unspecified: Secondary | ICD-10-CM

## 2020-11-25 DIAGNOSIS — E559 Vitamin D deficiency, unspecified: Secondary | ICD-10-CM

## 2020-11-25 LAB — LIPID PANEL
Cholesterol: 107 mg/dL (ref 0–200)
HDL: 31.4 mg/dL — ABNORMAL LOW (ref >39.00)
LDL Cholesterol: 59 mg/dL (ref 0–99)
NonHDL: 75.28
Total CHOL/HDL Ratio: 3
Triglycerides: 83 mg/dL (ref 0.0–149.0)
VLDL: 16.6 mg/dL (ref 0.0–40.0)

## 2020-11-25 LAB — TSH: TSH: 2.35 u[IU]/mL (ref 0.35–4.50)

## 2020-11-25 LAB — CBC WITH DIFFERENTIAL/PLATELET
Basophils Absolute: 0 10*3/uL (ref 0.0–0.1)
Basophils Relative: 0.5 % (ref 0.0–3.0)
Eosinophils Absolute: 0.1 10*3/uL (ref 0.0–0.7)
Eosinophils Relative: 1.3 % (ref 0.0–5.0)
HCT: 40.3 % (ref 39.0–52.0)
Hemoglobin: 13.9 g/dL (ref 13.0–17.0)
Lymphocytes Relative: 42.7 % (ref 12.0–46.0)
Lymphs Abs: 2.3 10*3/uL (ref 0.7–4.0)
MCHC: 34.6 g/dL (ref 30.0–36.0)
MCV: 92.2 fl (ref 78.0–100.0)
Monocytes Absolute: 0.7 10*3/uL (ref 0.1–1.0)
Monocytes Relative: 12.9 % — ABNORMAL HIGH (ref 3.0–12.0)
Neutro Abs: 2.3 10*3/uL (ref 1.4–7.7)
Neutrophils Relative %: 42.6 % — ABNORMAL LOW (ref 43.0–77.0)
Platelets: 288 10*3/uL (ref 150.0–400.0)
RBC: 4.37 Mil/uL (ref 4.22–5.81)
RDW: 12.7 % (ref 11.5–15.5)
WBC: 5.3 10*3/uL (ref 4.0–10.5)

## 2020-11-25 LAB — BASIC METABOLIC PANEL
BUN: 18 mg/dL (ref 6–23)
CO2: 29 mEq/L (ref 19–32)
Calcium: 9.3 mg/dL (ref 8.4–10.5)
Chloride: 101 mEq/L (ref 96–112)
Creatinine, Ser: 1.31 mg/dL (ref 0.40–1.50)
GFR: 59.97 mL/min — ABNORMAL LOW (ref >60.00)
Glucose, Bld: 129 mg/dL — ABNORMAL HIGH (ref 70–99)
Potassium: 4.6 mEq/L (ref 3.5–5.1)
Sodium: 136 mEq/L (ref 135–145)

## 2020-11-25 LAB — VITAMIN B12: Vitamin B-12: 492 pg/mL (ref 211–911)

## 2020-11-25 LAB — URINALYSIS, ROUTINE W REFLEX MICROSCOPIC
Bilirubin Urine: NEGATIVE
Ketones, ur: NEGATIVE
Leukocytes,Ua: NEGATIVE
Nitrite: NEGATIVE
Specific Gravity, Urine: 1.025 (ref 1.000–1.030)
Total Protein, Urine: NEGATIVE
Urine Glucose: NEGATIVE
Urobilinogen, UA: 0.2 (ref 0.0–1.0)
pH: 5.5 (ref 5.0–8.0)

## 2020-11-25 LAB — HEPATIC FUNCTION PANEL
ALT: 37 U/L (ref 0–53)
AST: 31 U/L (ref 0–37)
Albumin: 4.1 g/dL (ref 3.5–5.2)
Alkaline Phosphatase: 55 U/L (ref 39–117)
Bilirubin, Direct: 0.2 mg/dL (ref 0.0–0.3)
Total Bilirubin: 1.1 mg/dL (ref 0.2–1.2)
Total Protein: 7.4 g/dL (ref 6.0–8.3)

## 2020-11-25 LAB — MICROALBUMIN / CREATININE URINE RATIO
Creatinine,U: 129.5 mg/dL
Microalb Creat Ratio: 1.1 mg/g (ref 0.0–30.0)
Microalb, Ur: 1.4 mg/dL (ref 0.0–1.9)

## 2020-11-25 LAB — PSA: PSA: 0.8 ng/mL (ref 0.10–4.00)

## 2020-11-25 LAB — VITAMIN D 25 HYDROXY (VIT D DEFICIENCY, FRACTURES): VITD: 36.19 ng/mL (ref 30.00–100.00)

## 2020-11-30 ENCOUNTER — Other Ambulatory Visit: Payer: Self-pay

## 2020-12-01 ENCOUNTER — Ambulatory Visit (INDEPENDENT_AMBULATORY_CARE_PROVIDER_SITE_OTHER): Payer: BC Managed Care – PPO | Admitting: Internal Medicine

## 2020-12-01 ENCOUNTER — Encounter: Payer: Self-pay | Admitting: Internal Medicine

## 2020-12-01 VITALS — BP 150/86 | HR 74 | Temp 98.6°F | Ht 72.0 in | Wt 256.0 lb

## 2020-12-01 DIAGNOSIS — Z794 Long term (current) use of insulin: Secondary | ICD-10-CM

## 2020-12-01 DIAGNOSIS — E559 Vitamin D deficiency, unspecified: Secondary | ICD-10-CM

## 2020-12-01 DIAGNOSIS — E7800 Pure hypercholesterolemia, unspecified: Secondary | ICD-10-CM

## 2020-12-01 DIAGNOSIS — M25512 Pain in left shoulder: Secondary | ICD-10-CM | POA: Diagnosis not present

## 2020-12-01 DIAGNOSIS — L918 Other hypertrophic disorders of the skin: Secondary | ICD-10-CM

## 2020-12-01 DIAGNOSIS — E1122 Type 2 diabetes mellitus with diabetic chronic kidney disease: Secondary | ICD-10-CM | POA: Diagnosis not present

## 2020-12-01 DIAGNOSIS — Z0001 Encounter for general adult medical examination with abnormal findings: Secondary | ICD-10-CM | POA: Diagnosis not present

## 2020-12-01 DIAGNOSIS — I10 Essential (primary) hypertension: Secondary | ICD-10-CM | POA: Diagnosis not present

## 2020-12-01 DIAGNOSIS — N183 Chronic kidney disease, stage 3 unspecified: Secondary | ICD-10-CM

## 2020-12-01 NOTE — Patient Instructions (Addendum)
Please remember to call for your colonoscopy and eye exams soon  Ok to increase the Vit D to 4000 units per day if ok with your stomach  Please see Sports Medicine on the first floor about your left shoudler  You will be contacted regarding the referral for: Dermatology, and sports medicine  Please continue all other medications as before, and refills have been done if requested.  Please have the pharmacy call with any other refills you may need.  Please continue your efforts at being more active, low cholesterol diet, and weight control.  You are otherwise up to date with prevention measures today.  Please keep your appointments with your specialists as you may have planned - including Endo for the sugar  Please make an Appointment to return in 6 months, or sooner if needed, for BP and other issues
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
## 2020-12-04 ENCOUNTER — Encounter: Payer: Self-pay | Admitting: Internal Medicine

## 2020-12-04 ENCOUNTER — Other Ambulatory Visit: Payer: Self-pay | Admitting: Internal Medicine

## 2020-12-04 DIAGNOSIS — E559 Vitamin D deficiency, unspecified: Secondary | ICD-10-CM | POA: Insufficient documentation

## 2020-12-04 DIAGNOSIS — M25512 Pain in left shoulder: Secondary | ICD-10-CM | POA: Insufficient documentation

## 2020-12-04 DIAGNOSIS — L918 Other hypertrophic disorders of the skin: Secondary | ICD-10-CM | POA: Insufficient documentation

## 2020-12-04 NOTE — Assessment & Plan Note (Signed)
I suspect may not be well controlled, but pt defers change in tx for now, and plans to f/u BP at home,  to f/u any worsening symptoms or concerns

## 2020-12-04 NOTE — Assessment & Plan Note (Signed)
With decreased rom, suspect rot cuff disorder - ok for sport med referral

## 2020-12-04 NOTE — Assessment & Plan Note (Signed)
Lab Results  Component Value Date   HGBA1C 9.1 (A) 11/10/2020   Uncontrolled, pt to continue current medical treatment but needs close f/u with endo and pt to call for this  Current Outpatient Medications (Endocrine & Metabolic):  Marland Kitchen  Insulin Glargine (BASAGLAR KWIKPEN) 100 UNIT/ML, Inject 130 Units into the skin every morning. .  Semaglutide, 1 MG/DOSE, (OZEMPIC, 1 MG/DOSE,) 2 MG/1.5ML SOPN, Inject 1 mg into the skin once a week.  Current Outpatient Medications (Cardiovascular):  .  enalapril (VASOTEC) 20 MG tablet, TAKE 1 TABLET BY MOUTH EVERY DAY IN THE MORNING .  rosuvastatin (CRESTOR) 10 MG tablet, TAKE 1 TABLET BY MOUTH EVERY DAY   Current Outpatient Medications (Analgesics):  .  aspirin 81 MG EC tablet, Take 1 tablet (81 mg total) by mouth daily. Swallow whole.   Current Outpatient Medications (Other):  Marland Kitchen  BD PEN NEEDLE NANO 2ND GEN 32G X 4 MM MISC, USE AS DIRECTED .  glucose blood (ONETOUCH VERIO) test strip, 1 each by Other route 2 (two) times daily. And lancets 2/day .  Lancets (ONETOUCH DELICA PLUS [id number]) MISC, Apply topically. .  pantoprazole (PROTONIX) 40 MG tablet, Take 1 tablet (40 mg total) by mouth 2 (two) times daily before a meal.

## 2020-12-04 NOTE — Assessment & Plan Note (Signed)
Age and sex appropriate education and counseling updated with regular exercise and diet Referrals for preventative services - pt to f/u on his own for eye exam and colonoscopy  Immunizations addressed - none needed Smoking counseling  - none needed Evidence for depression or other mood disorder - none significant Most recent labs reviewed. I have personally reviewed and have noted: 1) the patient's medical and social history 2) The patient's current medications and supplements 3) The patient's height, weight, and BMI have been recorded in the chart

## 2020-12-04 NOTE — Assessment & Plan Note (Signed)
Last vitamin D Lab Results  Component Value Date   VD25OH 36.19 11/25/2020   Low normal, ok to increased vit d 4000 u daily, cont oral replacement

## 2020-12-04 NOTE — Assessment & Plan Note (Signed)
Lab Results  Component Value Date   LDLCALC 59 11/25/2020   Stable, pt to continue current statin crestor 10

## 2020-12-04 NOTE — Assessment & Plan Note (Signed)
With more symptoms, ok for derm referral

## 2020-12-05 NOTE — Telephone Encounter (Signed)
Please refill as per office routine med refill policy (all routine meds refilled for 3 mo or monthly per pt preference up to one year from last visit, then month to month grace period for 3 mo, then further med refills will have to be denied)  

## 2020-12-07 ENCOUNTER — Other Ambulatory Visit: Payer: Self-pay | Admitting: Internal Medicine

## 2020-12-14 NOTE — Progress Notes (Signed)
Subjective:    I'm seeing this patient as a consultation for:  Dr. Jenny Reichmann. Note will be routed back to referring provider/PCP.  CC: L shoulder pain  I, Molly Weber, LAT, ATC, am serving as scribe for Dr. Lynne Leader.  HPI: Pt is a 61 y/o male presenting w/ L shoulder pain x >3 months w/ no known MOI. He locates his pain to the anterior aspect of shoulder. Pt works as a Dealer at [employer].  He denies any specific injury.  He notes the pain started when he was fishing and did a rapid overhead motion with his left arm.  Pain is not worsened over the last few weeks especially with overhead motion.  It is bothersome with sleep and with reaching back.  Is able to work currently but he is slow down at work and having to be very careful.  L shoulder mechanical symptoms: no Aggravating factors: L shoulder overhead AROM esp flex/abd Treatments tried: creams  Past medical history, Surgical history, Family history, Social history, Allergies, and medications have been entered into the medical record, reviewed.   Review of Systems: No new headache, visual changes, nausea, vomiting, diarrhea, constipation, dizziness, abdominal pain, skin rash, fevers, chills, night sweats, weight loss, swollen lymph nodes, body aches, joint swelling, muscle aches, chest pain, shortness of breath, mood changes, visual or auditory hallucinations.   Objective:    Vitals:   12/15/20 0747  BP: (!) 156/92  Pulse: 70  SpO2: 97%   General: Well Developed, well nourished, and in no acute distress.  Neuro/Psych: Alert and oriented x3, extra-ocular muscles intact, able to move all 4 extremities, sensation grossly intact. Skin: Warm and dry, no rashes noted.  Respiratory: Not using accessory muscles, speaking in full sentences, trachea midline.  Cardiovascular: Pulses palpable, no extremity edema. Abdomen: Does not appear distended. MSK: Left shoulder normal-appearing Tender palpation mildly at [facility] joint. Range of motion  abduction 130 degrees with painful arc. Internal rotation posterior iliac crest. External rotation 30 degrees beyond neutral position. Intact strength abduction external and internal rotation. Mildly positive Hawkins and Neer's test.  Mildly positive empty can test. Mildly positive Yergason's and speeds test. Pulses capillary refill and sensation are intact distally.    Lab and Radiology Results  Procedure: Real-time Ultrasound Guided Injection of left shoulder subacromial bursa Device: Philips Affiniti 50G Images permanently stored and available for review in PACS Evaluation with ultrasound prior to injection reveals intact biceps tendon.  Intact subscapularis. Intact supraspinatus.  Moderate subacromial bursitis. Intact infraspinatus. Minimal AC effusion. Verbal informed consent obtained.  Discussed risks and benefits of procedure. Warned about infection bleeding damage to structures skin hypopigmentation and fat atrophy among others. Patient expresses understanding and agreement Time-out conducted.   Noted no overlying erythema, induration, or other signs of local infection.   Skin prepped in a sterile fashion.   Local anesthesia: Topical Ethyl chloride.   With sterile technique and under real time ultrasound guidance:  40 mg of Kenalog and 2 mL of Marcaine injected into shoulder bursa. Fluid seen entering the subacromial bursa.   Completed without difficulty   Pain immediately resolved suggesting accurate placement of the medication.   Advised to call if fevers/chills, erythema, induration, drainage, or persistent bleeding.   Images permanently stored and available for review in the ultrasound unit.  Impression: Technically successful ultrasound guided injection.    Shoulder x-ray ordered will be done outside location at a later date.   Impression and Recommendations:    Assessment and Plan:  60 y.o. male with left shoulder pain thought to be primarily due to subacromial  bursitis and impingement.  Doubtful for significant rotator cuff tear based on ultrasound appearance.  Will obtain an x-ray following the visit at the outside location.  Discussed rehab and physical therapy.  Formal PT would be the best option however patient would like to avoid that for now.  Plan for home exercises taught in clinic today by ATC.  If not improving on his own he will let me know and I will refer to [place] [facility] location.  Recheck back in about 6 weeks.  PDMP not reviewed this encounter. Orders Placed This Encounter  Procedures  . [place] LIMITED JOINT SPACE STRUCTURES UP LEFT(NO LINKED CHARGES)    Standing Status:   Future    Number of Occurrences:   1    Standing Expiration Date:   06/16/2021    Order Specific Question:   Reason for Exam (SYMPTOM  OR DIAGNOSIS REQUIRED)    Answer:   left shoulder pain    Order Specific Question:   Preferred imaging location?    Answer:   [place]  . DG Shoulder Left    Standing Status:   Future    Standing Expiration Date:   12/15/2021    Order Specific Question:   Reason for Exam (SYMPTOM  OR DIAGNOSIS REQUIRED)    Answer:   eval shoulder pain    Order Specific Question:   Preferred imaging location?    Answer:   [facility]   No orders of the defined types were placed in this encounter.   Discussed warning signs or symptoms. Please see discharge instructions. Patient expresses understanding.   The above documentation has been reviewed and is accurate and complete Lynne Leader, M.D.

## 2020-12-15 ENCOUNTER — Encounter: Payer: Self-pay | Admitting: Family Medicine

## 2020-12-15 ENCOUNTER — Other Ambulatory Visit: Payer: Self-pay

## 2020-12-15 ENCOUNTER — Ambulatory Visit (INDEPENDENT_AMBULATORY_CARE_PROVIDER_SITE_OTHER): Payer: BC Managed Care – PPO | Admitting: Family Medicine

## 2020-12-15 ENCOUNTER — Ambulatory Visit: Payer: Self-pay

## 2020-12-15 VITALS — BP 156/92 | HR 70 | Ht 72.0 in | Wt 255.6 lb

## 2020-12-15 DIAGNOSIS — M25512 Pain in left shoulder: Secondary | ICD-10-CM | POA: Diagnosis not present

## 2020-12-15 NOTE — Patient Instructions (Addendum)
Thank you for coming in today.  Please complete the exercises that the athletic trainer went over with you: View at [email or web address] using code: [email or web address]  Recheck in about 6 weeks especially if not better.   Look up shoulder bursitis and impingement.   Let me know if you want PT.   Plan for Xray at [facility]   Address: [address] [place], [place] [address] Phone: [phone]

## 2020-12-24 ENCOUNTER — Other Ambulatory Visit: Payer: Self-pay

## 2020-12-24 ENCOUNTER — Ambulatory Visit (INDEPENDENT_AMBULATORY_CARE_PROVIDER_SITE_OTHER): Payer: BC Managed Care – PPO

## 2020-12-24 DIAGNOSIS — M19012 Primary osteoarthritis, left shoulder: Secondary | ICD-10-CM | POA: Diagnosis not present

## 2020-12-24 DIAGNOSIS — M25512 Pain in left shoulder: Secondary | ICD-10-CM | POA: Diagnosis not present

## 2020-12-26 NOTE — Progress Notes (Signed)
X-ray left shoulder shows some arthritis.

## 2020-12-28 ENCOUNTER — Telehealth: Payer: Self-pay | Admitting: Internal Medicine

## 2020-12-28 ENCOUNTER — Other Ambulatory Visit: Payer: Self-pay

## 2020-12-28 NOTE — Telephone Encounter (Signed)
enalapril (VASOTEC) 20 MG tablet Patient calling, states his insurance does not cover one month supply, only 3 month supply

## 2020-12-30 ENCOUNTER — Other Ambulatory Visit: Payer: Self-pay

## 2020-12-30 MED ORDER — ENALAPRIL MALEATE 20 MG PO TABS: ORAL_TABLET | ORAL | 3 refills | Status: DC

## 2021-01-27 ENCOUNTER — Ambulatory Visit: Payer: BC Managed Care – PPO | Admitting: Family Medicine

## 2021-02-07 ENCOUNTER — Other Ambulatory Visit: Payer: Self-pay

## 2021-02-07 ENCOUNTER — Ambulatory Visit (INDEPENDENT_AMBULATORY_CARE_PROVIDER_SITE_OTHER): Payer: BC Managed Care – PPO | Admitting: Endocrinology

## 2021-02-07 VITALS — BP 160/78 | HR 71 | Ht 72.0 in | Wt 258.4 lb

## 2021-02-07 DIAGNOSIS — E1122 Type 2 diabetes mellitus with diabetic chronic kidney disease: Secondary | ICD-10-CM

## 2021-02-07 DIAGNOSIS — Z794 Long term (current) use of insulin: Secondary | ICD-10-CM | POA: Diagnosis not present

## 2021-02-07 DIAGNOSIS — N183 Chronic kidney disease, stage 3 unspecified: Secondary | ICD-10-CM

## 2021-02-07 LAB — POCT GLYCOSYLATED HEMOGLOBIN (HGB A1C): Hemoglobin A1C: 8.1 % — AB (ref 4.0–5.6)

## 2021-02-07 MED ORDER — OZEMPIC (0.25 OR 0.5 MG/DOSE) 2 MG/1.5ML ~~LOC~~ SOPN: 0.5000 mg | PEN_INJECTOR | SUBCUTANEOUS | 3 refills | Status: DC
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
## 2021-02-07 NOTE — Patient Instructions (Addendum)
Your blood pressure is high today.  Please see your primary care provider soon, to have it rechecked I have sent a prescription to your pharmacy, to resume the [place]. Please continue the same Basaglar, except take just 110 units on work days. check your blood sugar twice a day.  vary the time of day when you check, between before the 3 meals, and at bedtime.  also check if you have symptoms of your blood sugar being too high or too low.  please keep a record of the readings and bring it to your next appointment here (or you can bring the meter itself).  You can write it on any piece of paper.  please call us sooner if your blood sugar goes below 70, or if most of your readings are over 200.   Please come back for a follow-up appointment in 2 months.

## 2021-05-09 ENCOUNTER — Ambulatory Visit: Payer: BC Managed Care – PPO | Admitting: Endocrinology

## 2021-05-24 ENCOUNTER — Other Ambulatory Visit: Payer: Self-pay | Admitting: Endocrinology

## 2021-05-24 DIAGNOSIS — E1122 Type 2 diabetes mellitus with diabetic chronic kidney disease: Secondary | ICD-10-CM

## 2021-05-24 DIAGNOSIS — Z794 Long term (current) use of insulin: Secondary | ICD-10-CM

## 2021-05-29 ENCOUNTER — Ambulatory Visit: Payer: BC Managed Care – PPO | Admitting: Endocrinology

## 2021-06-05 ENCOUNTER — Other Ambulatory Visit: Payer: Self-pay

## 2021-06-05 ENCOUNTER — Encounter: Payer: Self-pay | Admitting: Internal Medicine

## 2021-06-05 ENCOUNTER — Ambulatory Visit: Payer: BC Managed Care – PPO | Admitting: Internal Medicine

## 2021-06-05 VITALS — BP 130/82 | HR 68 | Temp 98.1°F | Resp 18 | Ht 72.0 in | Wt 257.2 lb

## 2021-06-05 DIAGNOSIS — Z794 Long term (current) use of insulin: Secondary | ICD-10-CM

## 2021-06-05 DIAGNOSIS — N183 Chronic kidney disease, stage 3 unspecified: Secondary | ICD-10-CM

## 2021-06-05 DIAGNOSIS — J309 Allergic rhinitis, unspecified: Secondary | ICD-10-CM

## 2021-06-05 DIAGNOSIS — R42 Dizziness and giddiness: Secondary | ICD-10-CM | POA: Diagnosis not present

## 2021-06-05 DIAGNOSIS — Z0000 Encounter for general adult medical examination without abnormal findings: Secondary | ICD-10-CM

## 2021-06-05 DIAGNOSIS — I10 Essential (primary) hypertension: Secondary | ICD-10-CM | POA: Diagnosis not present

## 2021-06-05 DIAGNOSIS — E559 Vitamin D deficiency, unspecified: Secondary | ICD-10-CM

## 2021-06-05 DIAGNOSIS — E538 Deficiency of other specified B group vitamins: Secondary | ICD-10-CM

## 2021-06-05 DIAGNOSIS — E1122 Type 2 diabetes mellitus with diabetic chronic kidney disease: Secondary | ICD-10-CM

## 2021-06-05 MED ORDER — MECLIZINE HCL 12.5 MG PO TABS: 12.5000 mg | ORAL_TABLET | Freq: Three times a day (TID) | ORAL | 3 refills | Status: AC | PRN

## 2021-06-05 NOTE — Patient Instructions (Signed)
Please take all new medication as prescribed - the meclizine as needed  Please take all new medication as recommended - the OTC antihistamine, and OTC Nasacort, as well as Mucinex twice per day as needed  Please continue all other medications as before, and refills have been done if requested.  Please have the pharmacy call with any other refills you may need.  Please keep your appointments with your specialists as you may have planned - endocrinology for the sugar  Please make an Appointment to return in 6 months, or sooner if needed, also with Lab Appointment for testing done 3-5 days before at the [facility] (so this is for TWO appointments - please see the scheduling desk as you leave)  Due to the ongoing Covid 19 pandemic, our lab now requires an appointment for any labs done at our office.  If you need labs done and do not have an appointment, please call our office ahead of time to schedule before presenting to the lab for your testing.
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
## 2021-06-06 ENCOUNTER — Encounter: Payer: Self-pay | Admitting: Internal Medicine

## 2021-06-06 DIAGNOSIS — J309 Allergic rhinitis, unspecified: Secondary | ICD-10-CM | POA: Insufficient documentation

## 2021-06-06 NOTE — Assessment & Plan Note (Signed)
Last vitamin D Lab Results  Component Value Date   VD25OH 36.19 11/25/2020   Low, to start oral replacement

## 2021-06-06 NOTE — Assessment & Plan Note (Signed)
Mild uncontrolled, for antihistamin, nasacort asd

## 2021-06-06 NOTE — Assessment & Plan Note (Signed)
With mild left eustachian tube dysfxn, for meclizine prn, and mucinex bid prn,  to f/u any worsening symptoms or concerns

## 2021-06-06 NOTE — Assessment & Plan Note (Signed)
BP Readings from Last 3 Encounters:  06/05/21 130/82  02/07/21 (!) 160/78  12/15/20 (!) 156/92   Stable, pt to continue medical treatment vasotec

## 2021-06-06 NOTE — Assessment & Plan Note (Signed)
Stable by hx, for a1c with labs, goal a1c < 7

## 2021-06-26 ENCOUNTER — Other Ambulatory Visit: Payer: Self-pay | Admitting: Endocrinology

## 2021-06-26 ENCOUNTER — Other Ambulatory Visit: Payer: Self-pay

## 2021-06-26 ENCOUNTER — Telehealth: Payer: Self-pay | Admitting: Endocrinology

## 2021-06-26 DIAGNOSIS — Z794 Long term (current) use of insulin: Secondary | ICD-10-CM

## 2021-06-26 DIAGNOSIS — N183 Chronic kidney disease, stage 3 unspecified: Secondary | ICD-10-CM

## 2021-06-26 MED ORDER — BASAGLAR KWIKPEN 100 UNIT/ML ~~LOC~~ SOPN
PEN_INJECTOR | SUBCUTANEOUS | 0 refills | Status: DC
Start: 2021-06-26 — End: 2021-06-30

## 2021-06-26 NOTE — Telephone Encounter (Signed)
Patient called and needs a Advertising copywriter for [employer] sent to [facility] on [address] . Is completely out and has a an appointment with Dr Loanne Drilling on 06/30/21  Call back number if needed is [phone]

## 2021-06-30 ENCOUNTER — Other Ambulatory Visit: Payer: Self-pay

## 2021-06-30 ENCOUNTER — Ambulatory Visit: Payer: BC Managed Care – PPO | Admitting: Endocrinology

## 2021-06-30 VITALS — BP 150/80 | HR 66 | Ht 72.0 in | Wt 252.8 lb

## 2021-06-30 DIAGNOSIS — N183 Chronic kidney disease, stage 3 unspecified: Secondary | ICD-10-CM

## 2021-06-30 DIAGNOSIS — E1122 Type 2 diabetes mellitus with diabetic chronic kidney disease: Secondary | ICD-10-CM | POA: Diagnosis not present

## 2021-06-30 DIAGNOSIS — Z794 Long term (current) use of insulin: Secondary | ICD-10-CM

## 2021-06-30 LAB — POCT GLYCOSYLATED HEMOGLOBIN (HGB A1C): Hemoglobin A1C: 9.5 % — AB (ref 4.0–5.6)

## 2021-06-30 MED ORDER — BASAGLAR KWIKPEN 100 UNIT/ML ~~LOC~~ SOPN: 120.0000 [IU] | PEN_INJECTOR | SUBCUTANEOUS | 3 refills | Status: DC

## 2021-06-30 NOTE — Patient Instructions (Addendum)
Your blood pressure is high today.  Please see your primary care provider soon, to have it rechecked Please increase the Basaglar to 120 units each morning, and continue the same Ozempic check your blood sugar twice a day.  vary the time of day when you check, between before the 3 meals, and at bedtime.  also check if you have symptoms of your blood sugar being too high or too low.  please keep a record of the readings and bring it to your next appointment here (or you can bring the meter itself).  You can write it on any piece of paper.  please call us sooner if your blood sugar goes below 70, or if most of your readings are over 200.   Please come back for a follow-up appointment in 3 months.
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
## 2021-07-24 LAB — HM DIABETES EYE EXAM

## 2021-09-14 ENCOUNTER — Ambulatory Visit (INDEPENDENT_AMBULATORY_CARE_PROVIDER_SITE_OTHER): Payer: BC Managed Care – PPO | Admitting: Endocrinology

## 2021-09-14 ENCOUNTER — Other Ambulatory Visit: Payer: Self-pay

## 2021-09-14 VITALS — BP 146/54 | HR 64 | Ht 72.0 in | Wt 253.8 lb

## 2021-09-14 DIAGNOSIS — N183 Chronic kidney disease, stage 3 unspecified: Secondary | ICD-10-CM

## 2021-09-14 DIAGNOSIS — E1122 Type 2 diabetes mellitus with diabetic chronic kidney disease: Secondary | ICD-10-CM

## 2021-09-14 DIAGNOSIS — Z794 Long term (current) use of insulin: Secondary | ICD-10-CM

## 2021-09-14 LAB — POCT GLYCOSYLATED HEMOGLOBIN (HGB A1C): Hemoglobin A1C: 9.5 % — AB (ref 4.0–5.6)

## 2021-09-14 MED ORDER — OZEMPIC (0.25 OR 0.5 MG/DOSE) 2 MG/1.5ML ~~LOC~~ SOPN: 0.5000 mg | PEN_INJECTOR | SUBCUTANEOUS | 3 refills | Status: DC

## 2021-09-14 NOTE — Patient Instructions (Addendum)
Your blood pressure is high today.  Please see your primary care provider soon, to have it rechecked.   Please continue the same Basaglar, and: I have sent a prescription to your pharmacy, for the [place].   On this type of insulin schedule, you should eat meals on a regular schedule.  If a meal is missed or significantly delayed, your blood sugar could go low.  check your blood sugar twice a day.  vary the time of day when you check, between before the 3 meals, and at bedtime.  also check if you have symptoms of your blood sugar being too high or too low.  please keep a record of the readings and bring it to your next appointment here (or you can bring the meter itself).  You can write it on any piece of paper.  please call us sooner if your blood sugar goes below 70, or if most of your readings are over 200.   Please come back for a follow-up appointment in 2 months.
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
## 2021-09-22 ENCOUNTER — Other Ambulatory Visit: Payer: Self-pay | Admitting: Internal Medicine

## 2021-09-22 NOTE — Telephone Encounter (Signed)
Please refill as per office routine med refill policy (all routine meds to be refilled for 3 mo or monthly (per pt preference) up to one year from last visit, then month to month grace period for 3 mo, then further med refills will have to be denied) ? ?

## 2021-11-30 ENCOUNTER — Other Ambulatory Visit (INDEPENDENT_AMBULATORY_CARE_PROVIDER_SITE_OTHER): Payer: BC Managed Care – PPO

## 2021-11-30 DIAGNOSIS — N183 Chronic kidney disease, stage 3 unspecified: Secondary | ICD-10-CM | POA: Diagnosis not present

## 2021-11-30 DIAGNOSIS — E538 Deficiency of other specified B group vitamins: Secondary | ICD-10-CM

## 2021-11-30 DIAGNOSIS — E1122 Type 2 diabetes mellitus with diabetic chronic kidney disease: Secondary | ICD-10-CM | POA: Diagnosis not present

## 2021-11-30 DIAGNOSIS — E559 Vitamin D deficiency, unspecified: Secondary | ICD-10-CM

## 2021-11-30 DIAGNOSIS — Z125 Encounter for screening for malignant neoplasm of prostate: Secondary | ICD-10-CM | POA: Diagnosis not present

## 2021-11-30 DIAGNOSIS — Z794 Long term (current) use of insulin: Secondary | ICD-10-CM

## 2021-11-30 DIAGNOSIS — Z0000 Encounter for general adult medical examination without abnormal findings: Secondary | ICD-10-CM

## 2021-11-30 LAB — TSH: TSH: 2.51 u[IU]/mL (ref 0.35–5.50)

## 2021-11-30 LAB — LIPID PANEL
Cholesterol: 130 mg/dL (ref 0–200)
HDL: 38.3 mg/dL — ABNORMAL LOW (ref >39.00)
LDL Cholesterol: 79 mg/dL (ref 0–99)
NonHDL: 91.64
Total CHOL/HDL Ratio: 3
Triglycerides: 62 mg/dL (ref 0.0–149.0)
VLDL: 12.4 mg/dL (ref 0.0–40.0)

## 2021-11-30 LAB — CBC WITH DIFFERENTIAL/PLATELET
Basophils Absolute: 0 10*3/uL (ref 0.0–0.1)
Basophils Relative: 0.5 % (ref 0.0–3.0)
Eosinophils Absolute: 0.1 10*3/uL (ref 0.0–0.7)
Eosinophils Relative: 1.8 % (ref 0.0–5.0)
HCT: 41.9 % (ref 39.0–52.0)
Hemoglobin: 14.1 g/dL (ref 13.0–17.0)
Lymphocytes Relative: 38.7 % (ref 12.0–46.0)
Lymphs Abs: 2 10*3/uL (ref 0.7–4.0)
MCHC: 33.7 g/dL (ref 30.0–36.0)
MCV: 93.6 fl (ref 78.0–100.0)
Monocytes Absolute: 0.6 10*3/uL (ref 0.1–1.0)
Monocytes Relative: 12.4 % — ABNORMAL HIGH (ref 3.0–12.0)
Neutro Abs: 2.3 10*3/uL (ref 1.4–7.7)
Neutrophils Relative %: 46.6 % (ref 43.0–77.0)
Platelets: 280 10*3/uL (ref 150.0–400.0)
RBC: 4.47 Mil/uL (ref 4.22–5.81)
RDW: 12.7 % (ref 11.5–15.5)
WBC: 5.1 10*3/uL (ref 4.0–10.5)

## 2021-11-30 LAB — BASIC METABOLIC PANEL
BUN: 16 mg/dL (ref 6–23)
CO2: 31 mEq/L (ref 19–32)
Calcium: 9.5 mg/dL (ref 8.4–10.5)
Chloride: 100 mEq/L (ref 96–112)
Creatinine, Ser: 1.3 mg/dL (ref 0.40–1.50)
GFR: 60.1 mL/min (ref >60.00)
Glucose, Bld: 134 mg/dL — ABNORMAL HIGH (ref 70–99)
Potassium: 4.5 mEq/L (ref 3.5–5.1)
Sodium: 137 mEq/L (ref 135–145)

## 2021-11-30 LAB — URINALYSIS, ROUTINE W REFLEX MICROSCOPIC
Bilirubin Urine: NEGATIVE
Ketones, ur: NEGATIVE
Leukocytes,Ua: NEGATIVE
Nitrite: NEGATIVE
Specific Gravity, Urine: 1.02 (ref 1.000–1.030)
Total Protein, Urine: NEGATIVE
Urine Glucose: NEGATIVE
Urobilinogen, UA: 1 (ref 0.0–1.0)
pH: 6 (ref 5.0–8.0)

## 2021-11-30 LAB — HEMOGLOBIN A1C: Hgb A1c MFr Bld: 7 % — ABNORMAL HIGH (ref 4.6–6.5)

## 2021-11-30 LAB — HEPATIC FUNCTION PANEL
ALT: 45 U/L (ref 0–53)
AST: 35 U/L (ref 0–37)
Albumin: 4.4 g/dL (ref 3.5–5.2)
Alkaline Phosphatase: 56 U/L (ref 39–117)
Bilirubin, Direct: 0.2 mg/dL (ref 0.0–0.3)
Total Bilirubin: 0.9 mg/dL (ref 0.2–1.2)
Total Protein: 7.6 g/dL (ref 6.0–8.3)

## 2021-11-30 LAB — MICROALBUMIN / CREATININE URINE RATIO
Creatinine,U: 130 mg/dL
Microalb Creat Ratio: 1.3 mg/g (ref 0.0–30.0)
Microalb, Ur: 1.6 mg/dL (ref 0.0–1.9)

## 2021-11-30 LAB — VITAMIN B12: Vitamin B-12: 657 pg/mL (ref 211–911)

## 2021-11-30 LAB — PSA: PSA: 1 ng/mL (ref 0.10–4.00)

## 2021-11-30 LAB — VITAMIN D 25 HYDROXY (VIT D DEFICIENCY, FRACTURES): VITD: 56.47 ng/mL (ref 30.00–100.00)

## 2021-12-05 ENCOUNTER — Encounter: Payer: Self-pay | Admitting: Internal Medicine

## 2021-12-05 ENCOUNTER — Other Ambulatory Visit: Payer: Self-pay

## 2021-12-05 ENCOUNTER — Ambulatory Visit (INDEPENDENT_AMBULATORY_CARE_PROVIDER_SITE_OTHER): Payer: BC Managed Care – PPO | Admitting: Internal Medicine

## 2021-12-05 VITALS — BP 140/72 | HR 67 | Temp 98.9°F | Ht 72.0 in | Wt 250.2 lb

## 2021-12-05 DIAGNOSIS — Z0001 Encounter for general adult medical examination with abnormal findings: Secondary | ICD-10-CM | POA: Diagnosis not present

## 2021-12-05 DIAGNOSIS — Z794 Long term (current) use of insulin: Secondary | ICD-10-CM

## 2021-12-05 DIAGNOSIS — E7800 Pure hypercholesterolemia, unspecified: Secondary | ICD-10-CM | POA: Diagnosis not present

## 2021-12-05 DIAGNOSIS — I10 Essential (primary) hypertension: Secondary | ICD-10-CM

## 2021-12-05 DIAGNOSIS — N183 Chronic kidney disease, stage 3 unspecified: Secondary | ICD-10-CM

## 2021-12-05 DIAGNOSIS — E559 Vitamin D deficiency, unspecified: Secondary | ICD-10-CM

## 2021-12-05 DIAGNOSIS — E1122 Type 2 diabetes mellitus with diabetic chronic kidney disease: Secondary | ICD-10-CM

## 2021-12-05 MED ORDER — ROSUVASTATIN CALCIUM 20 MG PO TABS: 20.0000 mg | ORAL_TABLET | Freq: Every day | ORAL | 3 refills | Status: DC

## 2021-12-05 NOTE — Assessment & Plan Note (Signed)
Lab Results  ?Component Value Date  ? HGBA1C 7.0 (H) 11/30/2021  ? ?Stable, pt to continue current medical treatment basaglar ? ?

## 2021-12-05 NOTE — Assessment & Plan Note (Signed)
Lab Results  ?Component Value Date  ? [place] 79 11/30/2021  ? ?Mild uncontrolled, goal ldl < 70, pt to increase crestor 20 qd ? ?
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
## 2021-12-05 NOTE — Assessment & Plan Note (Signed)
Last vitamin D ?Lab Results  ?Component Value Date  ? VD25OH 56.47 11/30/2021  ? ?Stable, cont oral replacement ?

## 2021-12-05 NOTE — Assessment & Plan Note (Signed)
Age and sex appropriate education and counseling updated with regular exercise and diet ?Referrals for preventative services - declines colonoscopy ?Immunizations addressed - none needed ?Smoking counseling  - none needed ?Evidence for depression or other mood disorder - none significant ?Most recent labs reviewed. ?I have personally reviewed and have noted: ?1) the patient's medical and social history ?2) The patient's current medications and supplements ?3) The patient's height, weight, and BMI have been recorded in the chart ? ?

## 2021-12-05 NOTE — Patient Instructions (Signed)
Ok to increase the crestor to 20 mg per day for cholesterol ? ?Please continue all other medications as before, and refills have been done if requested. ? ?Please have the pharmacy call with any other refills you may need. ? ?Please continue your efforts at being more active, low cholesterol diet, and weight control. ? ?You are otherwise up to date with prevention measures today. ? ?Please keep your appointments with your specialists as you may have planned ? ?Please make an Appointment to return in 6 months, or sooner if needed, also with Lab Appointment for testing done 3-5 days before at the [facility] (so this is for TWO appointments - please see the scheduling desk as you leave) ? ?Due to the ongoing Covid 19 pandemic, our lab now requires an appointment for any labs done at our office.  If you need labs done and do not have an appointment, please call our office ahead of time to schedule before presenting to the lab for your testing. ? ? ?

## 2021-12-05 NOTE — Assessment & Plan Note (Signed)
BP Readings from Last 3 Encounters:  ?12/05/21 140/72  ?09/14/21 (!) 146/54  ?06/30/21 (!) 150/80  ? ?Stable, pt to continue medical treatment vasotec ? ?

## 2021-12-13 ENCOUNTER — Ambulatory Visit: Payer: BC Managed Care – PPO | Admitting: Endocrinology

## 2021-12-13 ENCOUNTER — Encounter: Payer: Self-pay | Admitting: Endocrinology

## 2021-12-13 DIAGNOSIS — Z794 Long term (current) use of insulin: Secondary | ICD-10-CM

## 2021-12-13 DIAGNOSIS — N183 Chronic kidney disease, stage 3 unspecified: Secondary | ICD-10-CM

## 2021-12-13 DIAGNOSIS — E1122 Type 2 diabetes mellitus with diabetic chronic kidney disease: Secondary | ICD-10-CM | POA: Diagnosis not present

## 2021-12-13 MED ORDER — BASAGLAR KWIKPEN 100 UNIT/ML ~~LOC~~ SOPN: 70.0000 [IU] | PEN_INJECTOR | SUBCUTANEOUS | 3 refills | Status: DC

## 2021-12-13 MED ORDER — SEMAGLUTIDE (1 MG/DOSE) 4 MG/3ML ~~LOC~~ SOPN: 1.0000 mg | PEN_INJECTOR | SUBCUTANEOUS | 3 refills | Status: DC
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
## 2021-12-13 NOTE — Patient Instructions (Addendum)
Your blood pressure is high today.  Please see your primary care provider soon, to have it rechecked.   ?Please reduce Basaglar to 70 units each morning, and:   ?I have sent a prescription to your pharmacy, to double the [place].   ?On this type of insulin schedule, you should eat meals on a regular schedule.  If a meal is missed or significantly delayed, your blood sugar could go low.   ?check your blood sugar twice a day.  vary the time of day when you check, between before the 3 meals, and at bedtime.  also check if you have symptoms of your blood sugar being too high or too low.  please keep a record of the readings and bring it to your next appointment here (or you can bring the meter itself).  You can write it on any piece of paper.  please call us sooner if your blood sugar goes below 70, or if most of your readings are over 200.   ?Please come back for a follow-up appointment in 3 months.   ?

## 2021-12-25 ENCOUNTER — Other Ambulatory Visit: Payer: Self-pay | Admitting: Internal Medicine

## 2022-01-22 ENCOUNTER — Other Ambulatory Visit: Payer: Self-pay | Admitting: Endocrinology

## 2022-01-31 ENCOUNTER — Other Ambulatory Visit: Payer: Self-pay | Admitting: Internal Medicine

## 2022-01-31 NOTE — Telephone Encounter (Signed)
For enalapril only - Please refill as per office routine med refill policy (all routine meds to be refilled for 3 mo or monthly (per pt preference) up to one year from last visit, then month to month grace period for 3 mo, then further med refills will have to be denied)

## 2022-02-01 ENCOUNTER — Ambulatory Visit: Payer: BC Managed Care – PPO | Admitting: Internal Medicine

## 2022-02-01 ENCOUNTER — Ambulatory Visit (INDEPENDENT_AMBULATORY_CARE_PROVIDER_SITE_OTHER): Payer: BC Managed Care – PPO

## 2022-02-01 VITALS — BP 158/96 | HR 68 | Temp 98.8°F | Ht 72.0 in | Wt 251.0 lb

## 2022-02-01 DIAGNOSIS — N183 Chronic kidney disease, stage 3 unspecified: Secondary | ICD-10-CM

## 2022-02-01 DIAGNOSIS — R2241 Localized swelling, mass and lump, right lower limb: Secondary | ICD-10-CM

## 2022-02-01 DIAGNOSIS — Z794 Long term (current) use of insulin: Secondary | ICD-10-CM

## 2022-02-01 DIAGNOSIS — E7800 Pure hypercholesterolemia, unspecified: Secondary | ICD-10-CM | POA: Diagnosis not present

## 2022-02-01 DIAGNOSIS — E1122 Type 2 diabetes mellitus with diabetic chronic kidney disease: Secondary | ICD-10-CM

## 2022-02-01 DIAGNOSIS — I10 Essential (primary) hypertension: Secondary | ICD-10-CM | POA: Diagnosis not present

## 2022-02-01 DIAGNOSIS — M7989 Other specified soft tissue disorders: Secondary | ICD-10-CM | POA: Diagnosis not present
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
## 2022-02-01 NOTE — Patient Instructions (Addendum)
Ok to stop the crestor  Please continue all other medications as before, and refills have been done if requested.  Please have the pharmacy call with any other refills you may need.  Please continue your efforts at being more active, low cholesterol diet, and weight control.  You are otherwise up to date with prevention measures today.  Please keep your appointments with your specialists as you may have planned - endocrinology next month  Please go to the XRAY Department in the first floor for the x-ray testing  You will be contacted by phone if any changes need to be made immediately.  Otherwise, you will receive a letter about your results with an explanation, but please check with MyChart first.  Please remember to sign up for MyChart if you have not done so, as this will be important to you in the future with finding out test results, communicating by private email, and scheduling acute appointments online when needed.

## 2022-02-04 ENCOUNTER — Encounter: Payer: Self-pay | Admitting: Internal Medicine

## 2022-02-04 NOTE — Assessment & Plan Note (Signed)
Lab Results  Component Value Date   HGBA1C 7.0 (H) 11/30/2021   Stable, pt to continue current medical treatment basaglar, ozempic

## 2022-02-04 NOTE — Assessment & Plan Note (Signed)
BP Readings from Last 3 Encounters:  02/01/22 (!) 158/96  12/13/21 (!) 170/88  12/05/21 140/72   uncontrolled, pt to continue medical treatment enalapril as declines change today

## 2022-02-04 NOTE — Assessment & Plan Note (Signed)
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

## 2022-02-04 NOTE — Assessment & Plan Note (Signed)
Unfortunately with recent leg cramps so stopped and cramps resolved, declines trial of differrent statin or lipid clinic for now  Lab Results  Component Value Date   CHOL 130 11/30/2021   CHOL 107 11/25/2020   CHOL 135 09/15/2019   Lab Results  Component Value Date   HDL 38.30 (L) 11/30/2021   HDL 31.40 (L) 11/25/2020   HDL 35.90 (L) 09/15/2019   Lab Results  Component Value Date   LDLCALC 79 11/30/2021   LDLCALC 59 11/25/2020   LDLCALC 77 09/15/2019   Lab Results  Component Value Date   TRIG 62.0 11/30/2021   TRIG 83.0 11/25/2020   TRIG 111.0 09/15/2019   Lab Results  Component Value Date   CHOLHDL 3 11/30/2021   CHOLHDL 3 11/25/2020   CHOLHDL 4 09/15/2019   No results found for: LDLDIRECT

## 2022-06-07 ENCOUNTER — Ambulatory Visit: Payer: BC Managed Care – PPO | Admitting: Internal Medicine

## 2022-06-07 VITALS — BP 138/78 | HR 62 | Temp 98.2°F | Wt 243.0 lb

## 2022-06-07 DIAGNOSIS — E559 Vitamin D deficiency, unspecified: Secondary | ICD-10-CM | POA: Diagnosis not present

## 2022-06-07 DIAGNOSIS — I10 Essential (primary) hypertension: Secondary | ICD-10-CM

## 2022-06-07 DIAGNOSIS — Z794 Long term (current) use of insulin: Secondary | ICD-10-CM

## 2022-06-07 DIAGNOSIS — N183 Chronic kidney disease, stage 3 unspecified: Secondary | ICD-10-CM | POA: Diagnosis not present

## 2022-06-07 DIAGNOSIS — E1122 Type 2 diabetes mellitus with diabetic chronic kidney disease: Secondary | ICD-10-CM

## 2022-06-07 DIAGNOSIS — Z23 Encounter for immunization: Secondary | ICD-10-CM

## 2022-06-07 DIAGNOSIS — E7800 Pure hypercholesterolemia, unspecified: Secondary | ICD-10-CM | POA: Diagnosis not present

## 2022-06-07 LAB — BASIC METABOLIC PANEL
BUN: 24 mg/dL — ABNORMAL HIGH (ref 6–23)
CO2: 28 mEq/L (ref 19–32)
Calcium: 9.9 mg/dL (ref 8.4–10.5)
Chloride: 99 mEq/L (ref 96–112)
Creatinine, Ser: 1.45 mg/dL (ref 0.40–1.50)
GFR: 52.53 mL/min — ABNORMAL LOW (ref >60.00)
Glucose, Bld: 163 mg/dL — ABNORMAL HIGH (ref 70–99)
Potassium: 4.3 mEq/L (ref 3.5–5.1)
Sodium: 134 mEq/L — ABNORMAL LOW (ref 135–145)

## 2022-06-07 LAB — HEPATIC FUNCTION PANEL
ALT: 53 U/L (ref 0–53)
AST: 36 U/L (ref 0–37)
Albumin: 4.4 g/dL (ref 3.5–5.2)
Alkaline Phosphatase: 58 U/L (ref 39–117)
Bilirubin, Direct: 0.2 mg/dL (ref 0.0–0.3)
Total Bilirubin: 0.9 mg/dL (ref 0.2–1.2)
Total Protein: 8.3 g/dL (ref 6.0–8.3)

## 2022-06-07 LAB — LIPID PANEL
Cholesterol: 116 mg/dL (ref 0–200)
HDL: 36.4 mg/dL — ABNORMAL LOW (ref >39.00)
LDL Cholesterol: 53 mg/dL (ref 0–99)
NonHDL: 79.54
Total CHOL/HDL Ratio: 3
Triglycerides: 132 mg/dL (ref 0.0–149.0)
VLDL: 26.4 mg/dL (ref 0.0–40.0)

## 2022-06-07 LAB — VITAMIN D 25 HYDROXY (VIT D DEFICIENCY, FRACTURES): VITD: 50.11 ng/mL (ref 30.00–100.00)

## 2022-06-07 LAB — HEMOGLOBIN A1C: Hgb A1c MFr Bld: 8.1 % — ABNORMAL HIGH (ref 4.6–6.5)

## 2022-06-07 NOTE — Patient Instructions (Addendum)
You had the flu shot today  Please have your Shingrix (shingles) shots done at your local pharmacy.  Please remember to see Dr Loanne Drilling  Please continue all other medications as before, and refills have been done if requested.  Please have the pharmacy call with any other refills you may need.  Please continue your efforts at being more active, low salt and Diabetic low cholesterol diet, and weight control  Please keep your appointments with your specialists as you may have planned  Please go to the LAB at the blood drawing area for the tests to be done  You will be contacted by phone if any changes need to be made immediately.  Otherwise, you will receive a letter about your results with an explanation, but please check with MyChart first.  Please remember to sign up for MyChart if you have not done so, as this will be important to you in the future with finding out test results, communicating by private email, and scheduling acute appointments online when needed.  Please make an Appointment to return in 6 months, or sooner if needed=
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
## 2022-06-09 NOTE — Assessment & Plan Note (Signed)
Lab Results  Component Value Date   HGBA1C 8.1 (H) 06/07/2022   uncontrolled, pt to continue current medical treatment basaglar 70 units and ozempic 1 mg weekly, pt declines change to day - plans to f/u with Dr Loanne Drilling endocrine

## 2022-06-09 NOTE — Assessment & Plan Note (Signed)
Last vitamin D Lab Results  Component Value Date   VD25OH 50.11 06/07/2022   Stable, cont oral replacement

## 2022-06-09 NOTE — Assessment & Plan Note (Signed)
Lab Results  Component Value Date   LDLCALC 53 06/07/2022   Stable, pt to continue current low chol diet, declines statin, has been crestor intolerant in past

## 2022-06-09 NOTE — Assessment & Plan Note (Signed)
BP Readings from Last 3 Encounters:  06/07/22 138/78  02/01/22 (!) 158/96  12/13/21 (!) 170/88   Stable, pt to continue medical treatment vasotec 20 mg qd

## 2022-08-24 ENCOUNTER — Telehealth: Payer: Self-pay | Admitting: Internal Medicine

## 2022-08-24 NOTE — Telephone Encounter (Signed)
F/u   The patient came by the office to pickup Ozempic two boxes were given to the patient .

## 2022-08-24 NOTE — Telephone Encounter (Signed)
1) Patient is calling to say that he has refills left on  Ozempic but he is having trouble getting it since the pharmacies do no have it and he wants to know what to do. 2) Per patient he states that his insurance will no longer cover the  Basaglar KwikPen Insulin Glargine (BASAGLAR KWIKPEN) 100 UNIT/ML and he wants to know what to do about that as well.

## 2022-08-24 NOTE — Telephone Encounter (Signed)
Spoke with the patient and he states none of his alternate pharmacies have it in [place]. Informed patient that we will provide him with 2 samples of the 0.2/0.'5mg'$  dose. He will need to take 2 doses of the 0.'5mg'$  to receive his full dosage of 0.'1mg'$ /week. Patient also says that the [employer] coverage letter was received in the mail. Advised him to check the letter if his insurance states any covered alternatives and he will let us know.

## 2022-10-22 ENCOUNTER — Ambulatory Visit: Payer: BC Managed Care – PPO | Admitting: Internal Medicine

## 2022-10-22 ENCOUNTER — Encounter: Payer: Self-pay | Admitting: Internal Medicine

## 2022-10-22 VITALS — BP 128/78 | HR 61 | Ht 72.0 in | Wt 234.0 lb

## 2022-10-22 DIAGNOSIS — Z794 Long term (current) use of insulin: Secondary | ICD-10-CM | POA: Diagnosis not present

## 2022-10-22 DIAGNOSIS — N183 Chronic kidney disease, stage 3 unspecified: Secondary | ICD-10-CM

## 2022-10-22 DIAGNOSIS — E1122 Type 2 diabetes mellitus with diabetic chronic kidney disease: Secondary | ICD-10-CM

## 2022-10-22 DIAGNOSIS — E7800 Pure hypercholesterolemia, unspecified: Secondary | ICD-10-CM

## 2022-10-22 LAB — POCT GLYCOSYLATED HEMOGLOBIN (HGB A1C): Hemoglobin A1C: 8.9 % — AB (ref 4.0–5.6)

## 2022-10-22 MED ORDER — SEMAGLUTIDE (1 MG/DOSE) 4 MG/3ML ~~LOC~~ SOPN: 1.0000 mg | PEN_INJECTOR | SUBCUTANEOUS | 3 refills | Status: DC

## 2022-10-22 MED ORDER — ONETOUCH VERIO VI STRP: 1.0000 | ORAL_STRIP | Freq: Two times a day (BID) | 3 refills | Status: DC

## 2022-10-22 MED ORDER — BD PEN NEEDLE NANO 2ND GEN 32G X 4 MM MISC: 3 refills | Status: DC

## 2022-10-22 MED ORDER — FREESTYLE LIBRE 3 SENSOR MISC: 1.0000 | 3 refills | Status: DC

## 2022-10-22 NOTE — Patient Instructions (Addendum)
Please decrease: - Basaglar 60 units in am  Restart: - Ozempic 1 mg weekly (for the first week take 0.5 mg weekly)  Please return for another visit in 3 months.   PATIENT INSTRUCTIONS FOR TYPE 2 DIABETES:  **Please join MyChart!** - see attached instructions about how to join if you have not done so already.  DIET AND EXERCISE Diet and exercise is an important part of diabetic treatment.  We recommended aerobic exercise in the form of brisk walking (working between 40-60% of maximal aerobic capacity, similar to brisk walking) for 150 minutes per week (such as 30 minutes five days per week) along with 3 times per week performing 'resistance' training (using various gauge rubber tubes with handles) 5-10 exercises involving the major muscle groups (upper body, lower body and core) performing 10-15 repetitions (or near fatigue) each exercise. Start at half the above goal but build slowly to reach the above goals. If limited by weight, joint pain, or disability, we recommend daily walking in a swimming pool with water up to waist to reduce pressure from joints while allow for adequate exercise.    BLOOD GLUCOSES Monitoring your blood glucoses is important for continued management of your diabetes. Please check your blood glucoses 2-4 times a day: fasting, before meals and at bedtime (you can rotate these measurements - e.g. one day check before the 3 meals, the next day check before 2 of the meals and before bedtime, etc.).   HYPOGLYCEMIA (low blood sugar) Hypoglycemia is usually a reaction to not eating, exercising, or taking too much insulin/ other diabetes drugs.  Symptoms include tremors, sweating, hunger, confusion, headache, etc. Treat IMMEDIATELY with 15 grams of Carbs: 4 glucose tablets  cup regular juice/soda 2 tablespoons raisins 4 teaspoons sugar 1 tablespoon honey Recheck blood glucose in 15 mins and repeat above if still symptomatic/blood glucose <100.  RECOMMENDATIONS TO  REDUCE YOUR RISK OF DIABETIC COMPLICATIONS: * Take your prescribed MEDICATION(S) * Follow a DIABETIC diet: Complex carbs, fiber rich foods, (monounsaturated and polyunsaturated) fats * AVOID saturated/trans fats, high fat foods, >2,300 mg salt per day. * EXERCISE at least 5 times a week for 30 minutes or preferably daily.  * DO NOT SMOKE OR DRINK more than 1 drink a day. * Check your FEET every day. Do not wear tightfitting shoes. Contact us if you develop an ulcer * See your EYE doctor once a year or more if needed * Get a FLU shot once a year * Get a PNEUMONIA vaccine once before and once after age 59 years  GOALS:  * Your Hemoglobin A1c of <7%  * fasting sugars need to be 80-130 * after meals sugars need to be <180 (2h after you start eating) * Your Systolic BP should be [id number] or lower  * Your Diastolic BP should be 80 or lower  * Your HDL (Good Cholesterol) should be 40 or higher  * Your LDL (Bad Cholesterol) should be ideally <70. * Your Triglycerides should be 150 or lower  * Your Urine microalbumin (kidney function) should be <30 * Your Body Mass Index should be 25 or lower   Please consider the following ways to cut down carbs and fat and increase fiber and micronutrients in your diet: - substitute whole grain for white bread or pasta - substitute brown rice for white rice - substitute 90-calorie flat bread pieces for slices of bread when possible - substitute sweet potatoes or yams for white potatoes - substitute humus for margarine - substitute  tofu for cheese when possible - substitute almond or rice milk for regular milk (would not drink soy milk daily due to concern for soy estrogen influence on breast cancer risk) - substitute dark chocolate for other sweets when possible - substitute water - can add lemon or orange slices for taste - for diet sodas (artificial sweeteners will trick your body that you can eat sweets without getting calories and will lead you to overeating  and weight gain in the long run) - do not skip breakfast or other meals (this will slow down the metabolism and will result in more weight gain over time)  - can try smoothies made from fruit and almond/rice milk in am instead of regular breakfast - can also try old-fashioned (not instant) oatmeal made with almond/rice milk in am - order the dressing on the side when eating salad at a restaurant (pour less than half of the dressing on the salad) - eat as little meat as possible - can try juicing, but should not forget that juicing will get rid of the fiber, so would alternate with eating raw veg./fruits or drinking smoothies - use as little oil as possible, even when using olive oil - can dress a salad with a mix of balsamic vinegar and lemon juice, for e.g. - use agave nectar, stevia sugar, or regular sugar rather than artificial sweateners - steam or broil/roast veggies  - snack on veggies/fruit/nuts (unsalted, preferably) when possible, rather than processed foods - reduce or eliminate aspartame in diet (it is in diet sodas, chewing gum, etc) Read the labels!  Try to read Dr. Janene Harvey book: "Program for Reversing Diabetes" for other ideas for healthy eating.
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
## 2022-10-25 ENCOUNTER — Encounter: Payer: Self-pay | Admitting: Internal Medicine

## 2022-10-28 ENCOUNTER — Encounter: Payer: Self-pay | Admitting: Internal Medicine

## 2022-10-28 DIAGNOSIS — N183 Chronic kidney disease, stage 3 unspecified: Secondary | ICD-10-CM

## 2022-10-29 MED ORDER — DEXCOM G7 RECEIVER DEVI: 0 refills | Status: DC

## 2022-10-29 MED ORDER — DEXCOM G7 SENSOR MISC: 3 refills | Status: DC

## 2022-10-29 MED ORDER — INSULIN GLARGINE 100 UNIT/ML SOLOSTAR PEN: 70.0000 [IU] | PEN_INJECTOR | Freq: Every day | SUBCUTANEOUS | 1 refills | Status: DC

## 2022-12-07 ENCOUNTER — Other Ambulatory Visit: Payer: Self-pay | Admitting: Internal Medicine

## 2022-12-07 ENCOUNTER — Encounter: Payer: BC Managed Care – PPO | Admitting: Internal Medicine

## 2023-01-23 ENCOUNTER — Ambulatory Visit (INDEPENDENT_AMBULATORY_CARE_PROVIDER_SITE_OTHER): Payer: BC Managed Care – PPO | Admitting: Internal Medicine

## 2023-01-23 ENCOUNTER — Encounter: Payer: Self-pay | Admitting: Internal Medicine

## 2023-01-23 VITALS — BP 130/82 | HR 56 | Ht 72.0 in | Wt 225.6 lb

## 2023-01-23 DIAGNOSIS — E119 Type 2 diabetes mellitus without complications: Secondary | ICD-10-CM

## 2023-01-23 DIAGNOSIS — E1122 Type 2 diabetes mellitus with diabetic chronic kidney disease: Secondary | ICD-10-CM

## 2023-01-23 DIAGNOSIS — Z7985 Long-term (current) use of injectable non-insulin antidiabetic drugs: Secondary | ICD-10-CM | POA: Diagnosis not present

## 2023-01-23 DIAGNOSIS — E7800 Pure hypercholesterolemia, unspecified: Secondary | ICD-10-CM

## 2023-01-23 DIAGNOSIS — Z794 Long term (current) use of insulin: Secondary | ICD-10-CM | POA: Diagnosis not present

## 2023-01-23 DIAGNOSIS — N183 Chronic kidney disease, stage 3 unspecified: Secondary | ICD-10-CM

## 2023-01-23 LAB — POCT GLYCOSYLATED HEMOGLOBIN (HGB A1C): Hemoglobin A1C: 7 % — AB (ref 4.0–5.6)

## 2023-01-23 MED ORDER — OZEMPIC (0.25 OR 0.5 MG/DOSE) 2 MG/1.5ML ~~LOC~~ SOPN: 0.5000 mg | PEN_INJECTOR | SUBCUTANEOUS | 3 refills | Status: DC

## 2023-01-23 NOTE — Patient Instructions (Addendum)
Please continue - Basaglar 50 units in am but be prepare to decrease the dose as you start Ozempic  Restart: - Ozempic 0.5 mg weekly   Please return for another visit in 3-4 months.
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
## 2023-02-08 ENCOUNTER — Encounter: Payer: Self-pay | Admitting: Internal Medicine

## 2023-03-05 ENCOUNTER — Other Ambulatory Visit: Payer: Self-pay | Admitting: Internal Medicine

## 2023-03-12 ENCOUNTER — Other Ambulatory Visit: Payer: Self-pay | Admitting: Internal Medicine

## 2023-03-12 ENCOUNTER — Other Ambulatory Visit: Payer: Self-pay

## 2023-03-24 ENCOUNTER — Other Ambulatory Visit: Payer: Self-pay | Admitting: Internal Medicine

## 2023-04-04 ENCOUNTER — Other Ambulatory Visit: Payer: Self-pay | Admitting: Internal Medicine

## 2023-04-05 ENCOUNTER — Other Ambulatory Visit: Payer: Self-pay | Admitting: Internal Medicine

## 2023-04-08 ENCOUNTER — Encounter: Payer: Self-pay | Admitting: Internal Medicine

## 2023-04-08 MED ORDER — ROSUVASTATIN CALCIUM 20 MG PO TABS: 20.0000 mg | ORAL_TABLET | Freq: Every day | ORAL | 3 refills | Status: DC

## 2023-04-08 NOTE — Addendum Note (Signed)
Addended by: Corwin Levins on: 04/08/2023 04:57 PM   Modules accepted: Orders

## 2023-04-08 NOTE — Telephone Encounter (Signed)
Med is not on med list. Was taking off back in March " change of ingredients"  pls advise.Marland KitchenRaechel Chute

## 2023-06-05 ENCOUNTER — Encounter: Payer: Self-pay | Admitting: Internal Medicine

## 2023-06-05 ENCOUNTER — Ambulatory Visit: Payer: BC Managed Care – PPO | Admitting: Internal Medicine

## 2023-06-05 VITALS — BP 130/70 | HR 60 | Ht 72.0 in | Wt 227.6 lb

## 2023-06-05 DIAGNOSIS — N183 Chronic kidney disease, stage 3 unspecified: Secondary | ICD-10-CM | POA: Diagnosis not present

## 2023-06-05 DIAGNOSIS — E1122 Type 2 diabetes mellitus with diabetic chronic kidney disease: Secondary | ICD-10-CM | POA: Diagnosis not present

## 2023-06-05 DIAGNOSIS — Z7985 Long-term (current) use of injectable non-insulin antidiabetic drugs: Secondary | ICD-10-CM

## 2023-06-05 DIAGNOSIS — Z794 Long term (current) use of insulin: Secondary | ICD-10-CM

## 2023-06-05 DIAGNOSIS — E7800 Pure hypercholesterolemia, unspecified: Secondary | ICD-10-CM

## 2023-06-05 LAB — HEMOGLOBIN A1C: Hemoglobin A1C: 7.6

## 2023-06-05 MED ORDER — OZEMPIC (0.25 OR 0.5 MG/DOSE) 2 MG/1.5ML ~~LOC~~ SOPN: 0.5000 mg | PEN_INJECTOR | SUBCUTANEOUS | Status: DC

## 2023-06-05 MED ORDER — INSULIN GLARGINE 100 UNIT/ML SOLOSTAR PEN
44.0000 [IU] | PEN_INJECTOR | Freq: Every day | SUBCUTANEOUS | Status: DC
Start: 2023-06-05 — End: 2023-10-10

## 2023-06-05 NOTE — Patient Instructions (Addendum)
Please reduce: - Basaglar 44 units in am  Start: - Ozempic 0.5 mg weekly   Look up the Stelo sensor.  Let me know how the sugars are doing in 1 month. Check some sugars at bedtime.  Please return for another visit in 3 months.
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
## 2023-06-25 ENCOUNTER — Encounter: Payer: Self-pay | Admitting: Internal Medicine

## 2023-06-25 DIAGNOSIS — N183 Chronic kidney disease, stage 3 unspecified: Secondary | ICD-10-CM

## 2023-06-25 MED ORDER — BASAGLAR KWIKPEN 100 UNIT/ML ~~LOC~~ SOPN
44.0000 [IU] | PEN_INJECTOR | Freq: Every day | SUBCUTANEOUS | 3 refills | Status: DC
Start: 2023-06-25 — End: 2023-10-10

## 2023-06-25 MED ORDER — OZEMPIC (0.25 OR 0.5 MG/DOSE) 2 MG/1.5ML ~~LOC~~ SOPN: 0.5000 mg | PEN_INJECTOR | SUBCUTANEOUS | 1 refills | Status: DC

## 2023-07-01 MED ORDER — ONETOUCH VERIO VI STRP: 1.0000 | ORAL_STRIP | Freq: Two times a day (BID) | 3 refills | Status: DC

## 2023-07-01 NOTE — Addendum Note (Signed)
Addended by: Pollie Meyer on: 07/01/2023 08:07 AM   Modules accepted: Orders

## 2023-10-03 ENCOUNTER — Other Ambulatory Visit: Payer: Self-pay | Admitting: Internal Medicine

## 2023-10-03 DIAGNOSIS — E1122 Type 2 diabetes mellitus with diabetic chronic kidney disease: Secondary | ICD-10-CM

## 2023-10-10 ENCOUNTER — Encounter: Payer: Self-pay | Admitting: Internal Medicine

## 2023-10-10 ENCOUNTER — Ambulatory Visit (INDEPENDENT_AMBULATORY_CARE_PROVIDER_SITE_OTHER): Payer: BC Managed Care – PPO | Admitting: Internal Medicine

## 2023-10-10 VITALS — BP 110/70 | HR 60 | Ht 72.0 in | Wt 225.8 lb

## 2023-10-10 DIAGNOSIS — Z794 Long term (current) use of insulin: Secondary | ICD-10-CM | POA: Diagnosis not present

## 2023-10-10 DIAGNOSIS — E7800 Pure hypercholesterolemia, unspecified: Secondary | ICD-10-CM | POA: Diagnosis not present

## 2023-10-10 DIAGNOSIS — N183 Chronic kidney disease, stage 3 unspecified: Secondary | ICD-10-CM | POA: Diagnosis not present

## 2023-10-10 DIAGNOSIS — E1122 Type 2 diabetes mellitus with diabetic chronic kidney disease: Secondary | ICD-10-CM | POA: Diagnosis not present

## 2023-10-10 LAB — POCT GLYCOSYLATED HEMOGLOBIN (HGB A1C): Hemoglobin A1C: 8.5 % — AB (ref 4.0–5.6)

## 2023-10-10 LAB — MICROALBUMIN / CREATININE URINE RATIO
Creatinine, Urine: 121 mg/dL (ref 20–320)
Microalb Creat Ratio: 43 mg/g{creat} — ABNORMAL HIGH (ref <30)
Microalb, Ur: 5.2 mg/dL

## 2023-10-10 MED ORDER — BD PEN NEEDLE NANO 2ND GEN 32G X 4 MM MISC: 3 refills | Status: AC

## 2023-10-10 MED ORDER — INSULIN GLARGINE 100 UNIT/ML SOLOSTAR PEN: 40.0000 [IU] | PEN_INJECTOR | Freq: Every day | SUBCUTANEOUS | 3 refills | Status: DC

## 2023-10-10 NOTE — Patient Instructions (Addendum)
Please restart: - Lantus 30 units at night and increase the dose by 4 units every 2 days until sugars in am are <130  Continue: - Ozempic 0.5 mg weekly   Check some sugars at bedtime.  Please return for another visit in 3 months.
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
## 2023-11-05 ENCOUNTER — Telehealth: Payer: Self-pay | Admitting: Internal Medicine

## 2023-11-05 DIAGNOSIS — E7800 Pure hypercholesterolemia, unspecified: Secondary | ICD-10-CM

## 2023-11-05 DIAGNOSIS — E538 Deficiency of other specified B group vitamins: Secondary | ICD-10-CM

## 2023-11-05 DIAGNOSIS — N183 Chronic kidney disease, stage 3 unspecified: Secondary | ICD-10-CM

## 2023-11-05 DIAGNOSIS — Z125 Encounter for screening for malignant neoplasm of prostate: Secondary | ICD-10-CM

## 2023-11-05 DIAGNOSIS — E559 Vitamin D deficiency, unspecified: Secondary | ICD-10-CM

## 2023-11-05 NOTE — Telephone Encounter (Signed)
 Ok labs are ordered

## 2023-11-05 NOTE — Telephone Encounter (Signed)
 Pt will like his labs ordered and taking place a week before his CPE if possible. Please advice, Thanks

## 2023-11-06 NOTE — Telephone Encounter (Signed)
 Called and let Pt know

## 2023-11-12 ENCOUNTER — Other Ambulatory Visit (INDEPENDENT_AMBULATORY_CARE_PROVIDER_SITE_OTHER)

## 2023-11-12 DIAGNOSIS — E538 Deficiency of other specified B group vitamins: Secondary | ICD-10-CM

## 2023-11-12 DIAGNOSIS — E1122 Type 2 diabetes mellitus with diabetic chronic kidney disease: Secondary | ICD-10-CM | POA: Diagnosis not present

## 2023-11-12 DIAGNOSIS — E559 Vitamin D deficiency, unspecified: Secondary | ICD-10-CM

## 2023-11-12 DIAGNOSIS — Z794 Long term (current) use of insulin: Secondary | ICD-10-CM

## 2023-11-12 DIAGNOSIS — Z125 Encounter for screening for malignant neoplasm of prostate: Secondary | ICD-10-CM | POA: Diagnosis not present

## 2023-11-12 DIAGNOSIS — N183 Chronic kidney disease, stage 3 unspecified: Secondary | ICD-10-CM | POA: Diagnosis not present

## 2023-11-12 LAB — HEPATIC FUNCTION PANEL
ALT: 26 U/L (ref 0–53)
AST: 26 U/L (ref 0–37)
Albumin: 4.4 g/dL (ref 3.5–5.2)
Alkaline Phosphatase: 51 U/L (ref 39–117)
Bilirubin, Direct: 0.2 mg/dL (ref 0.0–0.3)
Total Bilirubin: 1 mg/dL (ref 0.2–1.2)
Total Protein: 7.7 g/dL (ref 6.0–8.3)

## 2023-11-12 LAB — BASIC METABOLIC PANEL
BUN: 16 mg/dL (ref 6–23)
CO2: 29 meq/L (ref 19–32)
Calcium: 9.4 mg/dL (ref 8.4–10.5)
Chloride: 104 meq/L (ref 96–112)
Creatinine, Ser: 1.24 mg/dL (ref 0.40–1.50)
GFR: 62.74 mL/min (ref >60.00)
Glucose, Bld: 129 mg/dL — ABNORMAL HIGH (ref 70–99)
Potassium: 4.2 meq/L (ref 3.5–5.1)
Sodium: 139 meq/L (ref 135–145)

## 2023-11-12 LAB — URINALYSIS, ROUTINE W REFLEX MICROSCOPIC
Bilirubin Urine: NEGATIVE
Ketones, ur: NEGATIVE
Leukocytes,Ua: NEGATIVE
Nitrite: NEGATIVE
Specific Gravity, Urine: >=1.03 — AB (ref 1.000–1.030)
Total Protein, Urine: NEGATIVE
Urine Glucose: 250 — AB
Urobilinogen, UA: 0.2 (ref 0.0–1.0)
pH: 5.5 (ref 5.0–8.0)

## 2023-11-12 LAB — CBC WITH DIFFERENTIAL/PLATELET
Basophils Absolute: 0 10*3/uL (ref 0.0–0.1)
Basophils Relative: 0.5 % (ref 0.0–3.0)
Eosinophils Absolute: 0.1 10*3/uL (ref 0.0–0.7)
Eosinophils Relative: 1.1 % (ref 0.0–5.0)
HCT: 40.3 % (ref 39.0–52.0)
Hemoglobin: 13.7 g/dL (ref 13.0–17.0)
Lymphocytes Relative: 40.2 % (ref 12.0–46.0)
Lymphs Abs: 2.7 10*3/uL (ref 0.7–4.0)
MCHC: 34.1 g/dL (ref 30.0–36.0)
MCV: 94.7 fl (ref 78.0–100.0)
Monocytes Absolute: 0.7 10*3/uL (ref 0.1–1.0)
Monocytes Relative: 10.7 % (ref 3.0–12.0)
Neutro Abs: 3.2 10*3/uL (ref 1.4–7.7)
Neutrophils Relative %: 47.5 % (ref 43.0–77.0)
Platelets: 293 10*3/uL (ref 150.0–400.0)
RBC: 4.25 Mil/uL (ref 4.22–5.81)
RDW: 12.3 % (ref 11.5–15.5)
WBC: 6.7 10*3/uL (ref 4.0–10.5)

## 2023-11-12 LAB — LIPID PANEL
Cholesterol: 132 mg/dL (ref 0–200)
HDL: 39.5 mg/dL (ref >39.00)
LDL Cholesterol: 68 mg/dL (ref 0–99)
NonHDL: 92.1
Total CHOL/HDL Ratio: 3
Triglycerides: 120 mg/dL (ref 0.0–149.0)
VLDL: 24 mg/dL (ref 0.0–40.0)

## 2023-11-12 LAB — VITAMIN D 25 HYDROXY (VIT D DEFICIENCY, FRACTURES): VITD: 38.62 ng/mL (ref 30.00–100.00)

## 2023-11-12 LAB — MICROALBUMIN / CREATININE URINE RATIO
Creatinine,U: 184 mg/dL
Microalb Creat Ratio: 15.1 mg/g (ref 0.0–30.0)
Microalb, Ur: 2.8 mg/dL — ABNORMAL HIGH (ref 0.0–1.9)

## 2023-11-12 LAB — HEMOGLOBIN A1C: Hgb A1c MFr Bld: 8.4 % — ABNORMAL HIGH (ref 4.6–6.5)

## 2023-11-12 LAB — TSH: TSH: 1.57 u[IU]/mL (ref 0.35–5.50)

## 2023-11-12 LAB — PSA: PSA: 1.95 ng/mL (ref 0.10–4.00)

## 2023-11-12 LAB — VITAMIN B12: Vitamin B-12: 647 pg/mL (ref 211–911)

## 2023-11-16 ENCOUNTER — Other Ambulatory Visit: Payer: Self-pay | Admitting: Internal Medicine

## 2023-11-16 DIAGNOSIS — Z794 Long term (current) use of insulin: Secondary | ICD-10-CM

## 2023-11-19 ENCOUNTER — Ambulatory Visit (INDEPENDENT_AMBULATORY_CARE_PROVIDER_SITE_OTHER): Payer: BC Managed Care – PPO | Admitting: Internal Medicine

## 2023-11-19 ENCOUNTER — Encounter: Payer: Self-pay | Admitting: Internal Medicine

## 2023-11-19 VITALS — BP 142/80 | HR 65 | Temp 98.3°F | Ht 72.0 in | Wt 226.0 lb

## 2023-11-19 DIAGNOSIS — R972 Elevated prostate specific antigen [PSA]: Secondary | ICD-10-CM | POA: Diagnosis not present

## 2023-11-19 DIAGNOSIS — N183 Chronic kidney disease, stage 3 unspecified: Secondary | ICD-10-CM

## 2023-11-19 DIAGNOSIS — Z794 Long term (current) use of insulin: Secondary | ICD-10-CM

## 2023-11-19 DIAGNOSIS — E559 Vitamin D deficiency, unspecified: Secondary | ICD-10-CM | POA: Diagnosis not present

## 2023-11-19 DIAGNOSIS — Z0001 Encounter for general adult medical examination with abnormal findings: Secondary | ICD-10-CM

## 2023-11-19 DIAGNOSIS — R3129 Other microscopic hematuria: Secondary | ICD-10-CM | POA: Diagnosis not present

## 2023-11-19 DIAGNOSIS — I10 Essential (primary) hypertension: Secondary | ICD-10-CM | POA: Diagnosis not present

## 2023-11-19 DIAGNOSIS — Z0000 Encounter for general adult medical examination without abnormal findings: Secondary | ICD-10-CM | POA: Diagnosis not present

## 2023-11-19 DIAGNOSIS — E1122 Type 2 diabetes mellitus with diabetic chronic kidney disease: Secondary | ICD-10-CM

## 2023-11-19 NOTE — Patient Instructions (Addendum)
.  Please have your Shingrix (shingles) shots done at your local pharmacy.  Ok to increase the Vit D3 to 2000 units per day  Please remember to see your eye doctor and colonoscopy with GI soon  Please continue all other medications as before, and refills have been done if requested.  Please have the pharmacy call with any other refills you may need.  Please continue your efforts at being more active, low cholesterol diet, and weight control.  You are otherwise up to date with prevention measures today.  Please keep your appointments with your specialists as you may have planned - endo on May 1  You will be contacted regarding the referral for: CT renal, and urology for the blood in the urine  Please make an Appointment to return in 6 months, or sooner if needed, also with Lab Appointment for testing done 3-5 days before at the FIRST FLOOR Lab (so this is for TWO appointments - please see the scheduling desk as you leave) - to include the repeat PSA
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
## 2023-11-23 ENCOUNTER — Encounter: Payer: Self-pay | Admitting: Internal Medicine

## 2023-11-23 DIAGNOSIS — R3129 Other microscopic hematuria: Secondary | ICD-10-CM | POA: Insufficient documentation

## 2023-11-23 DIAGNOSIS — R972 Elevated prostate specific antigen [PSA]: Secondary | ICD-10-CM | POA: Insufficient documentation

## 2023-11-23 NOTE — Assessment & Plan Note (Addendum)
 Last vitamin D Lab Results  Component Value Date   VD25OH 38.62 11/12/2023   Low, to increase oral replacement to 2000 u qd

## 2023-11-23 NOTE — Assessment & Plan Note (Signed)
 Lab Results  Component Value Date   HGBA1C 8.4 (H) 11/12/2023   Uncontrolled,, pt to continue current medical treatment lantus 45 u every day, ozempic 0.5 mg weekly - f/u endo may 1 as planned per pt preference

## 2023-11-23 NOTE — Assessment & Plan Note (Signed)
 Lab Results  Component Value Date   PSA 1.95 11/12/2023   PSA 1.00 11/30/2021   PSA 0.80 11/25/2020   Tending up - for f/u psa at 6 mo

## 2023-11-23 NOTE — Assessment & Plan Note (Addendum)
 Incidental found on UA - for ct renal and urology referral

## 2023-11-23 NOTE — Assessment & Plan Note (Signed)
 BP Readings from Last 3 Encounters:  11/19/23 (!) 142/80  10/10/23 110/70  06/05/23 130/70   uncontrolled pt to continue medical treatment vasotec 20 every day, declines further change

## 2023-11-23 NOTE — Assessment & Plan Note (Signed)
 Age and sex appropriate education and counseling updated with regular exercise and diet Referrals for preventative services - pt to call for eye exam and colonoscopy himself soon Immunizations addressed - declines flu shot, for shingrx at pharmacy Smoking counseling  - none needed Evidence for depression or other mood disorder - none significant Most recent labs reviewed. I have personally reviewed and have noted: 1) the patient's medical and social history 2) The patient's current medications and supplements 3) The patient's height, weight, and BMI have been recorded in the chart

## 2023-11-27 ENCOUNTER — Telehealth: Payer: Self-pay

## 2023-11-27 NOTE — Telephone Encounter (Signed)
 Copied from CRM [id number]. Topic: Clinical - Request for Lab/Test Order >> Nov 27, 2023 10:06 AM Florestine Avers wrote: Reason for CRM: Patient states that he is trying to schedule a CT scan and he got a message saying that he needs to contact his doctor. Patient is requesting a call back from the nurse with next steps on how he should proceed.

## 2023-11-27 NOTE — Telephone Encounter (Signed)
 The CT scan and urology referrals are listed as being placed on mar 11  Unfortunately we have no control over the next steps, but he should hear soon about getting these scheduled   thanks

## 2023-12-03 ENCOUNTER — Telehealth: Payer: Self-pay

## 2023-12-03 NOTE — Telephone Encounter (Signed)
 Patient was identified as falling into the [place] Measure - Diabetes.   Patient was: Appointment scheduled for lab or office visit for A1c.  Patient is on a 6 month schedule, due for A1c in September and is scheduled in September

## 2023-12-17 ENCOUNTER — Encounter: Payer: Self-pay | Admitting: Internal Medicine

## 2024-01-04 ENCOUNTER — Other Ambulatory Visit: Payer: Self-pay | Admitting: Internal Medicine

## 2024-01-09 ENCOUNTER — Encounter: Payer: Self-pay | Admitting: Internal Medicine

## 2024-01-09 ENCOUNTER — Ambulatory Visit (INDEPENDENT_AMBULATORY_CARE_PROVIDER_SITE_OTHER): Payer: BC Managed Care – PPO | Admitting: Internal Medicine

## 2024-01-09 DIAGNOSIS — Z794 Long term (current) use of insulin: Secondary | ICD-10-CM | POA: Diagnosis not present

## 2024-01-09 DIAGNOSIS — E1122 Type 2 diabetes mellitus with diabetic chronic kidney disease: Secondary | ICD-10-CM

## 2024-01-09 DIAGNOSIS — N183 Chronic kidney disease, stage 3 unspecified: Secondary | ICD-10-CM

## 2024-01-09 LAB — POCT GLYCOSYLATED HEMOGLOBIN (HGB A1C): Hemoglobin A1C: 6.6 % — AB (ref 4.0–5.6)

## 2024-01-09 MED ORDER — INSULIN GLARGINE 100 UNIT/ML SOLOSTAR PEN: 30.0000 [IU] | PEN_INJECTOR | Freq: Every day | SUBCUTANEOUS | Status: DC

## 2024-01-09 NOTE — Patient Instructions (Addendum)
 Please decrease: - Lantus  30 units and move it to nighttime  Continue: - Ozempic  0.5 mg weekly   Check some sugars at bedtime.  Please return for another visit in 3-4 months.
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
## 2024-01-23 DIAGNOSIS — N5201 Erectile dysfunction due to arterial insufficiency: Secondary | ICD-10-CM | POA: Diagnosis not present

## 2024-01-23 DIAGNOSIS — Z125 Encounter for screening for malignant neoplasm of prostate: Secondary | ICD-10-CM | POA: Diagnosis not present

## 2024-01-23 DIAGNOSIS — R3121 Asymptomatic microscopic hematuria: Secondary | ICD-10-CM | POA: Diagnosis not present

## 2024-02-10 DIAGNOSIS — R3121 Asymptomatic microscopic hematuria: Secondary | ICD-10-CM | POA: Diagnosis not present

## 2024-02-21 DIAGNOSIS — R3121 Asymptomatic microscopic hematuria: Secondary | ICD-10-CM | POA: Diagnosis not present

## 2024-02-21 DIAGNOSIS — R3129 Other microscopic hematuria: Secondary | ICD-10-CM | POA: Diagnosis not present

## 2024-02-21 DIAGNOSIS — R319 Hematuria, unspecified: Secondary | ICD-10-CM | POA: Diagnosis not present

## 2024-03-25 DIAGNOSIS — R3121 Asymptomatic microscopic hematuria: Secondary | ICD-10-CM | POA: Diagnosis not present

## 2024-04-09 ENCOUNTER — Other Ambulatory Visit: Payer: Self-pay | Admitting: Internal Medicine

## 2024-04-14 IMAGING — DX DG ANKLE COMPLETE 3+V*R*
3 series · 3 of 3 positions shown · non-contrast
Comparison: None Available.

CLINICAL DATA: Lateral right ankle mass.

EXAM:
RIGHT ANKLE - COMPLETE 3+ VIEW

[ankle ap]
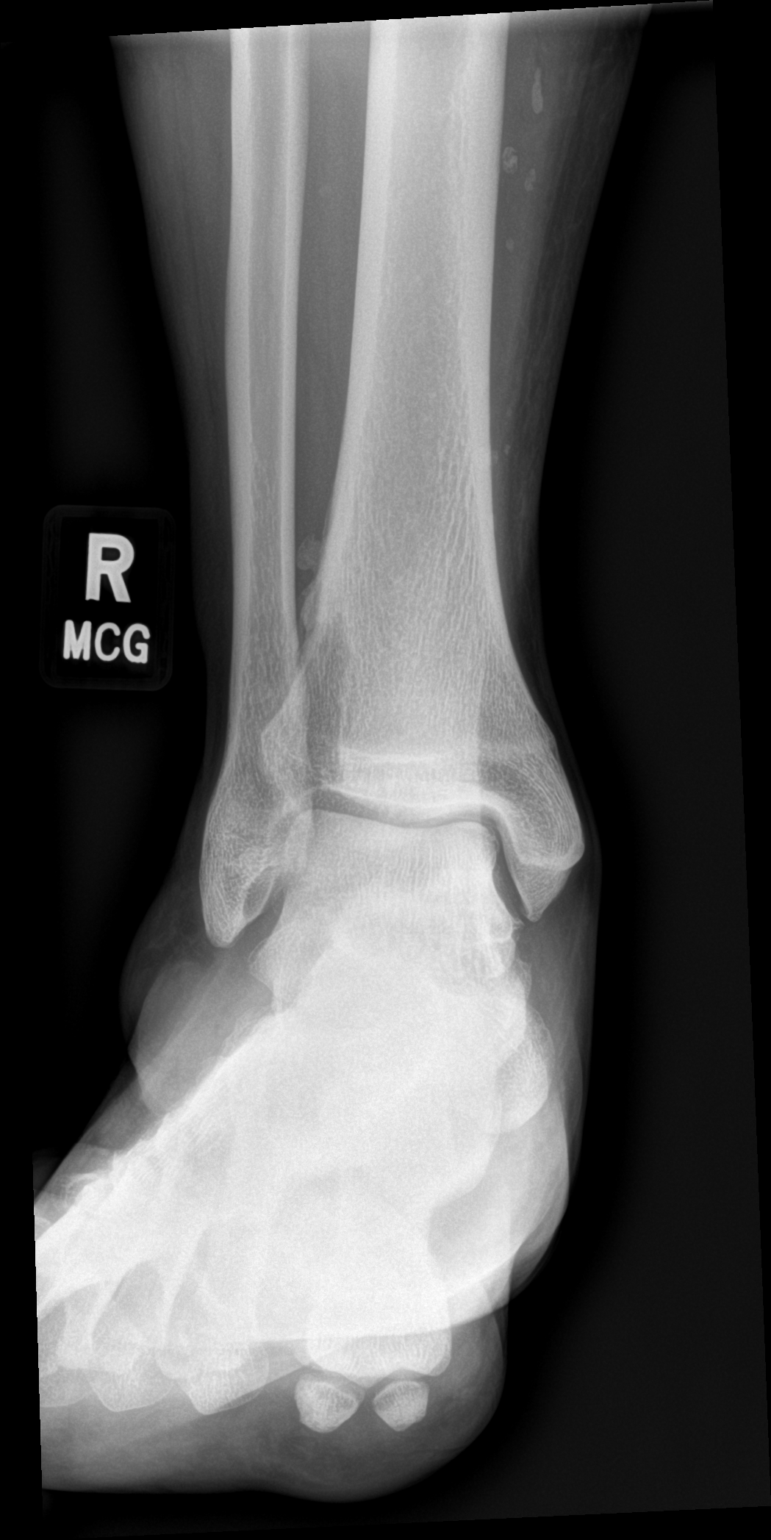

[ankle obl]
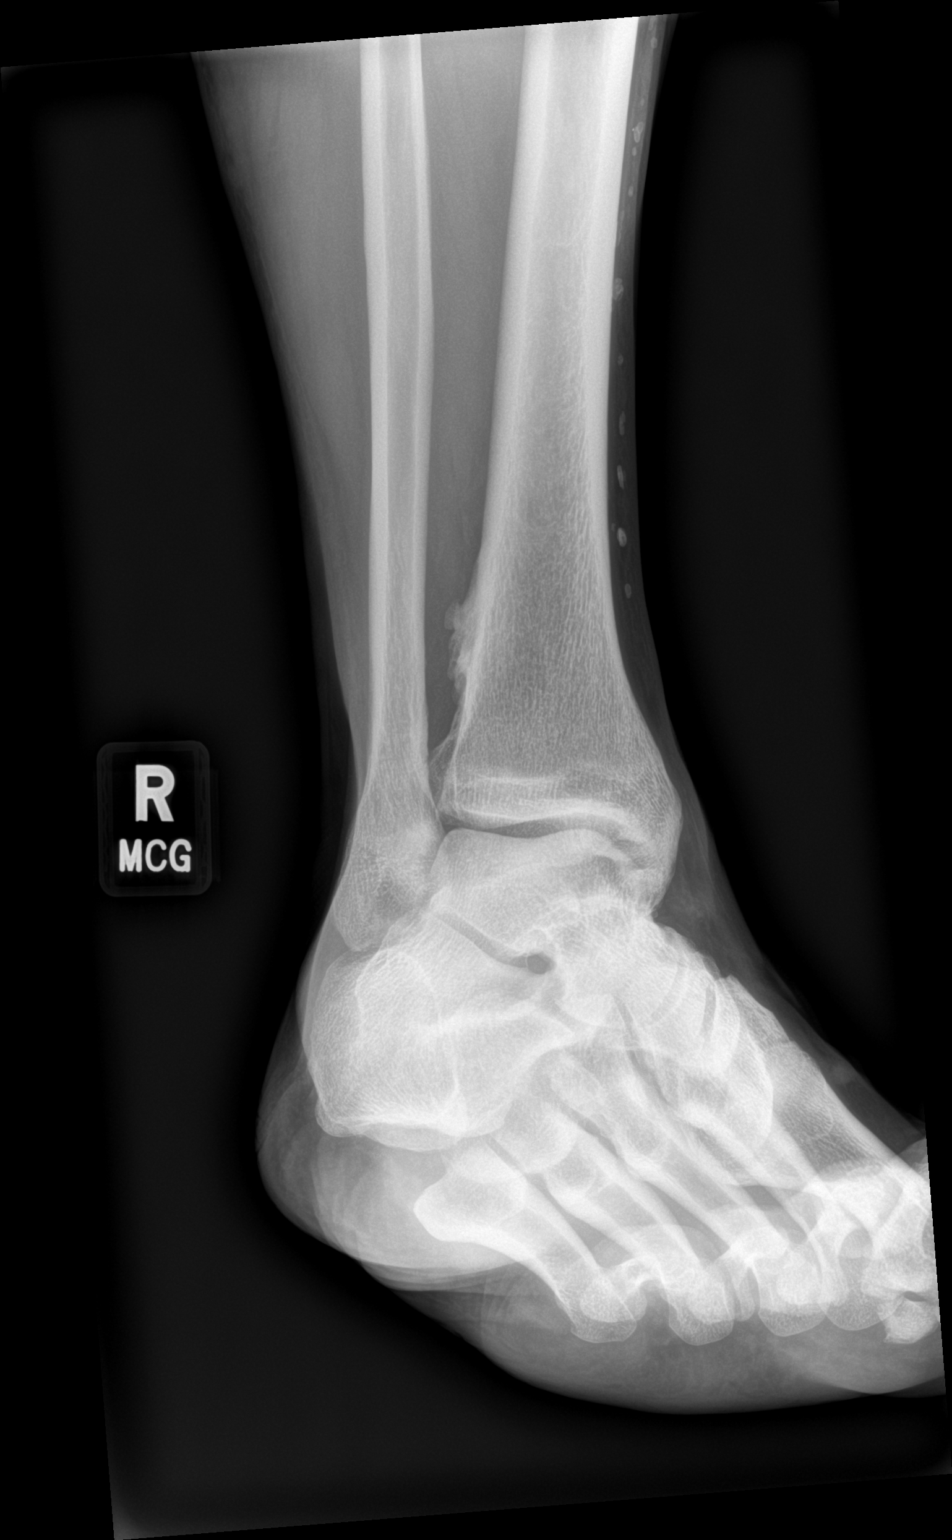

[ankle lat]
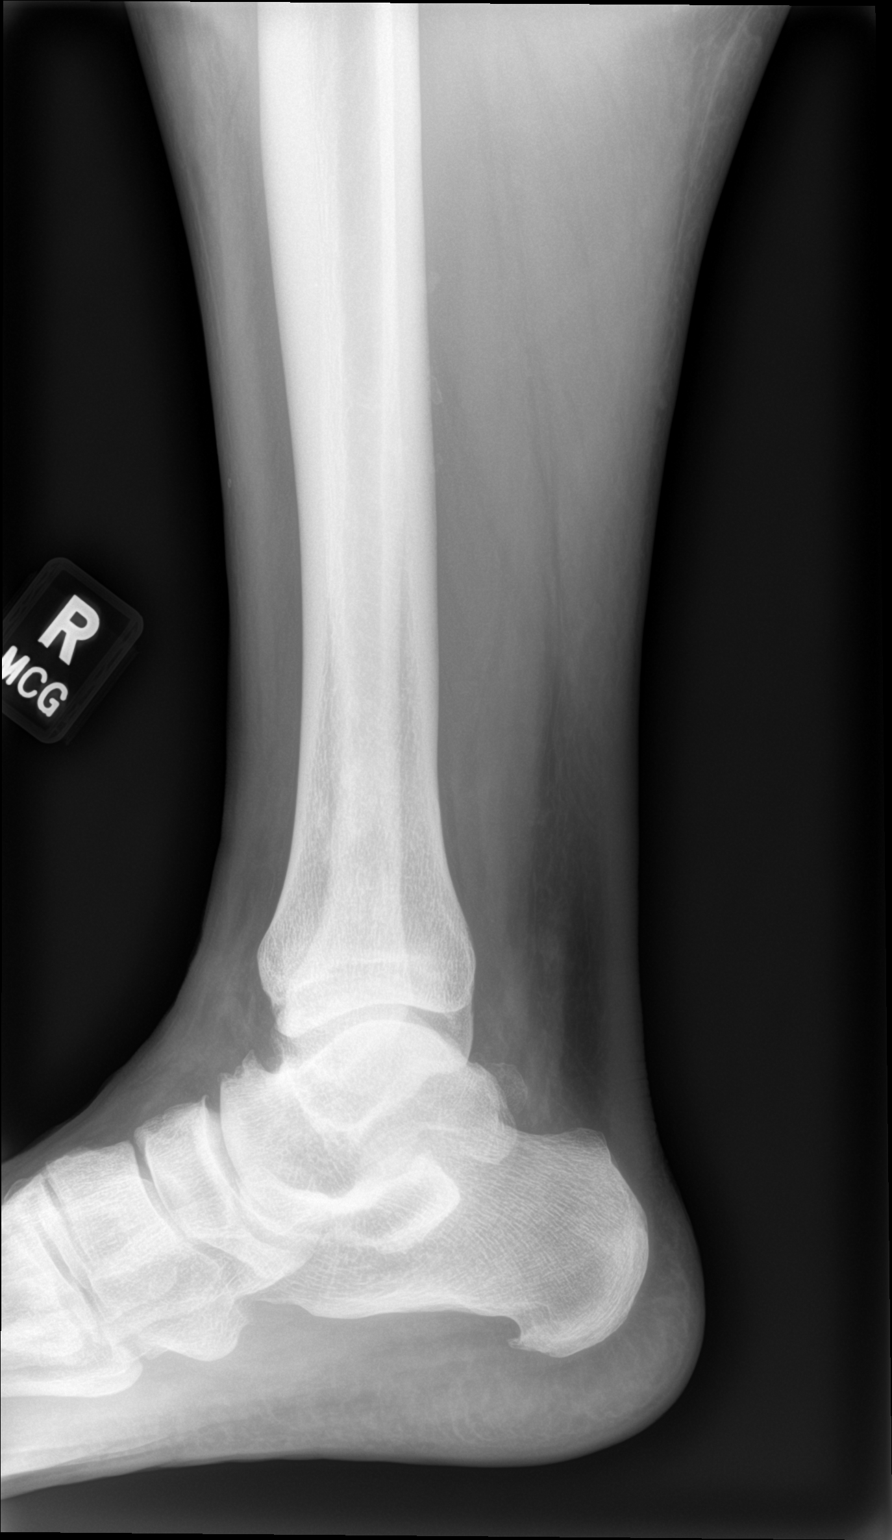

[3 of 3 positions shown; findings below may reference images not displayed]

FINDINGS: There is soft tissue swelling of the lateral ankle. There is no
evidence of fracture, dislocation, or joint effusion. No focal bone
lesion identified. There is mild degenerative spurring of the dorsal
talonavicular joint.
IMPRESSION: 1. Soft tissue swelling of the lateral ankle.
2. No acute bony abnormality.

## 2024-05-15 ENCOUNTER — Ambulatory Visit (INDEPENDENT_AMBULATORY_CARE_PROVIDER_SITE_OTHER): Admitting: Internal Medicine

## 2024-05-15 ENCOUNTER — Encounter: Payer: Self-pay | Admitting: Internal Medicine

## 2024-05-15 VITALS — BP 124/70 | HR 62 | Ht 72.0 in | Wt 220.4 lb

## 2024-05-15 DIAGNOSIS — Z794 Long term (current) use of insulin: Secondary | ICD-10-CM

## 2024-05-15 DIAGNOSIS — N183 Chronic kidney disease, stage 3 unspecified: Secondary | ICD-10-CM

## 2024-05-15 DIAGNOSIS — E1122 Type 2 diabetes mellitus with diabetic chronic kidney disease: Secondary | ICD-10-CM

## 2024-05-15 DIAGNOSIS — E7800 Pure hypercholesterolemia, unspecified: Secondary | ICD-10-CM | POA: Diagnosis not present

## 2024-05-15 LAB — POCT GLYCOSYLATED HEMOGLOBIN (HGB A1C): Hemoglobin A1C: 6.8 % — AB (ref 4.0–5.6)
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
## 2024-05-15 NOTE — Patient Instructions (Addendum)
 Please continue: - Lantus  40 units at night - Ozempic  0.5 mg weekly   Check some sugars at bedtime.  Please return for another visit in 3-4 months.

## 2024-05-18 ENCOUNTER — Other Ambulatory Visit: Payer: Self-pay | Admitting: Internal Medicine

## 2024-05-21 ENCOUNTER — Ambulatory Visit: Admitting: Internal Medicine

## 2024-09-03 ENCOUNTER — Other Ambulatory Visit: Payer: Self-pay | Admitting: Endocrinology

## 2024-09-03 DIAGNOSIS — E1122 Type 2 diabetes mellitus with diabetic chronic kidney disease: Secondary | ICD-10-CM
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
## 2024-09-23 ENCOUNTER — Encounter: Payer: Self-pay | Admitting: Internal Medicine

## 2024-09-23 ENCOUNTER — Ambulatory Visit: Admitting: Internal Medicine

## 2024-09-23 VITALS — BP 122/70 | HR 63 | Ht 72.0 in | Wt 226.0 lb

## 2024-09-23 DIAGNOSIS — Z7985 Long-term (current) use of injectable non-insulin antidiabetic drugs: Secondary | ICD-10-CM | POA: Diagnosis not present

## 2024-09-23 DIAGNOSIS — Z794 Long term (current) use of insulin: Secondary | ICD-10-CM | POA: Diagnosis not present

## 2024-09-23 DIAGNOSIS — N183 Chronic kidney disease, stage 3 unspecified: Secondary | ICD-10-CM

## 2024-09-23 DIAGNOSIS — E7800 Pure hypercholesterolemia, unspecified: Secondary | ICD-10-CM | POA: Diagnosis not present

## 2024-09-23 DIAGNOSIS — E1122 Type 2 diabetes mellitus with diabetic chronic kidney disease: Secondary | ICD-10-CM | POA: Diagnosis not present

## 2024-09-23 LAB — POCT GLYCOSYLATED HEMOGLOBIN (HGB A1C): Hemoglobin A1C: 10.1 % — AB (ref 4.0–5.6)

## 2024-09-23 MED ORDER — OZEMPIC (1 MG/DOSE) 4 MG/3ML ~~LOC~~ SOPN: 1.0000 mg | PEN_INJECTOR | SUBCUTANEOUS | 3 refills | Status: AC

## 2024-09-23 MED ORDER — FREESTYLE LIBRE 3 PLUS SENSOR MISC: 1.0000 | 3 refills | Status: AC

## 2024-09-23 NOTE — Addendum Note (Signed)
 Addended by: CLEOTILDE ROLIN RAMAN on: 09/23/2024 09:04 AM   Modules accepted: Orders

## 2024-09-23 NOTE — Patient Instructions (Addendum)
 Please continue: - Basaglar  40 units at night  Increase: - Ozempic  1 mg weekly   Start the sensor.  Look up Lingo CGM.  Please return for another visit in 1 month.

## 2024-10-22 ENCOUNTER — Ambulatory Visit: Admitting: Internal Medicine
